# Patient Record
Sex: Male | Born: 1935 | Race: White | Hispanic: No | Marital: Single | Smoking: Never smoker
Health system: Southern US, Community
[De-identification: ages and names within clinical notes are randomized; demographics above are authoritative.]

## PROBLEM LIST (undated history)

## (undated) DIAGNOSIS — K219 Gastro-esophageal reflux disease without esophagitis: Secondary | ICD-10-CM

## (undated) DIAGNOSIS — E785 Hyperlipidemia, unspecified: Secondary | ICD-10-CM

## (undated) DIAGNOSIS — I10 Essential (primary) hypertension: Secondary | ICD-10-CM

## (undated) DIAGNOSIS — L570 Actinic keratosis: Secondary | ICD-10-CM

## (undated) DIAGNOSIS — Z9289 Personal history of other medical treatment: Secondary | ICD-10-CM

## (undated) HISTORY — DX: Actinic keratosis: L57.0

## (undated) HISTORY — PX: OTHER SURGICAL HISTORY: SHX169

## (undated) SURGERY — Surgical Case
Anesthesia: *Unknown

---

## 2011-05-21 HISTORY — PX: REPLACEMENT TOTAL KNEE: SUR1224

## 2011-08-19 HISTORY — PX: KNEE ARTHROSCOPY: SUR90

## 2014-05-20 DIAGNOSIS — Z9289 Personal history of other medical treatment: Secondary | ICD-10-CM

## 2014-05-20 HISTORY — DX: Personal history of other medical treatment: Z92.89

## 2014-08-18 DIAGNOSIS — I10 Essential (primary) hypertension: Secondary | ICD-10-CM | POA: Insufficient documentation

## 2014-08-18 DIAGNOSIS — E785 Hyperlipidemia, unspecified: Secondary | ICD-10-CM | POA: Insufficient documentation

## 2016-01-12 DIAGNOSIS — M779 Enthesopathy, unspecified: Secondary | ICD-10-CM | POA: Insufficient documentation

## 2016-01-12 DIAGNOSIS — Z981 Arthrodesis status: Secondary | ICD-10-CM | POA: Insufficient documentation

## 2016-02-07 ENCOUNTER — Encounter
Admission: RE | Disposition: A | Discharge status: Home / Self Care | Payer: Self-pay | Source: Ambulatory Visit | Attending: Surgery

## 2016-02-07 ENCOUNTER — Ambulatory Visit: Payer: Medicare Other | Admitting: Anesthesiology

## 2016-02-07 ENCOUNTER — Ambulatory Visit
Admission: RE | Admit: 2016-02-07 | Discharge: 2016-02-07 | Disposition: A | Discharge status: Home / Self Care | Payer: Medicare Other | Source: Ambulatory Visit | Attending: Surgery | Admitting: Surgery

## 2016-02-07 DIAGNOSIS — M109 Gout, unspecified: Secondary | ICD-10-CM | POA: Insufficient documentation

## 2016-02-07 DIAGNOSIS — R2231 Localized swelling, mass and lump, right upper limb: Secondary | ICD-10-CM | POA: Diagnosis present

## 2016-02-07 DIAGNOSIS — I10 Essential (primary) hypertension: Secondary | ICD-10-CM | POA: Diagnosis not present

## 2016-02-07 DIAGNOSIS — K219 Gastro-esophageal reflux disease without esophagitis: Secondary | ICD-10-CM | POA: Insufficient documentation

## 2016-02-07 DIAGNOSIS — Z472 Encounter for removal of internal fixation device: Secondary | ICD-10-CM | POA: Diagnosis not present

## 2016-02-07 DIAGNOSIS — Z79899 Other long term (current) drug therapy: Secondary | ICD-10-CM | POA: Insufficient documentation

## 2016-02-07 DIAGNOSIS — E785 Hyperlipidemia, unspecified: Secondary | ICD-10-CM | POA: Diagnosis not present

## 2016-02-07 HISTORY — PX: HARDWARE REMOVAL: SHX979

## 2016-02-07 HISTORY — DX: Gastro-esophageal reflux disease without esophagitis: K21.9

## 2016-02-07 HISTORY — DX: Essential (primary) hypertension: I10

## 2016-02-07 HISTORY — DX: Personal history of other medical treatment: Z92.89

## 2016-02-07 HISTORY — DX: Hyperlipidemia, unspecified: E78.5

## 2016-02-07 SURGERY — REMOVAL, HARDWARE
Anesthesia: Monitor Anesthesia Care | Laterality: Right | Wound class: Clean

## 2016-02-07 MED ORDER — BUPIVACAINE HCL (PF) 0.5 % IJ SOLN
INTRAMUSCULAR | Status: DC | PRN
  Administered 2016-02-07: 5 mL

## 2016-02-07 MED ORDER — OXYCODONE HCL 5 MG/5ML PO SOLN: 5.0000 mg | Freq: Once | ORAL | Status: DC | PRN

## 2016-02-07 MED ORDER — MIDAZOLAM HCL 5 MG/5ML IJ SOLN
INTRAMUSCULAR | Status: DC | PRN
  Administered 2016-02-07: 2 mg via INTRAVENOUS

## 2016-02-07 MED ORDER — FENTANYL CITRATE (PF) 100 MCG/2ML IJ SOLN
INTRAMUSCULAR | Status: DC | PRN
  Administered 2016-02-07 (×2): 50 ug via INTRAVENOUS

## 2016-02-07 MED ORDER — PROPOFOL 500 MG/50ML IV EMUL
INTRAVENOUS | Status: DC | PRN
  Administered 2016-02-07: 75 ug/kg/min via INTRAVENOUS

## 2016-02-07 MED ORDER — FENTANYL CITRATE (PF) 100 MCG/2ML IJ SOLN: 25.0000 ug | INTRAMUSCULAR | Status: DC | PRN

## 2016-02-07 MED ORDER — OXYCODONE HCL 5 MG PO TABS: 5.0000 mg | ORAL_TABLET | Freq: Once | ORAL | Status: DC | PRN

## 2016-02-07 MED ORDER — LIDOCAINE HCL (CARDIAC) 20 MG/ML IV SOLN
INTRAVENOUS | Status: DC | PRN
  Administered 2016-02-07: 50 mg via INTRAVENOUS

## 2016-02-07 MED ORDER — ONDANSETRON HCL 4 MG/2ML IJ SOLN: 4.0000 mg | Freq: Once | INTRAMUSCULAR | Status: DC | PRN

## 2016-02-07 MED ORDER — CEFAZOLIN SODIUM-DEXTROSE 2-4 GM/100ML-% IV SOLN
2.0000 g | Freq: Once | INTRAVENOUS | Status: AC
  Administered 2016-02-07: 2 g via INTRAVENOUS

## 2016-02-07 MED ORDER — LACTATED RINGERS IV SOLN
INTRAVENOUS | Status: DC
  Administered 2016-02-07: 12:00:00 via INTRAVENOUS

## 2016-02-07 SURGICAL SUPPLY — 34 items
BANDAGE ELASTIC 3 LF NS (GAUZE/BANDAGES/DRESSINGS) IMPLANT
BANDAGE ELASTIC 4 CLIP ST LF (GAUZE/BANDAGES/DRESSINGS) IMPLANT
BNDG COHESIVE 4X5 TAN STRL (GAUZE/BANDAGES/DRESSINGS) ×2 IMPLANT
BNDG GAUZE 1X2.1 STRL (MISCELLANEOUS) ×2 IMPLANT
CANISTER SUCT 1200ML W/VALVE (MISCELLANEOUS) ×2 IMPLANT
CHLORAPREP W/TINT 26ML (MISCELLANEOUS) ×4 IMPLANT
CORD BIP STRL DISP 12FT (MISCELLANEOUS) IMPLANT
COVER LIGHT HANDLE UNIVERSAL (MISCELLANEOUS) ×4 IMPLANT
DRAPE FLUOR MINI C-ARM 54X84 (DRAPES) ×2 IMPLANT
DRAPE INCISE IOBAN 66X45 STRL (DRAPES) IMPLANT
GAUZE PACKING 1/4X5YD (GAUZE/BANDAGES/DRESSINGS) ×2 IMPLANT
GAUZE PETRO XEROFOAM 1X8 (MISCELLANEOUS) ×2 IMPLANT
GAUZE SPONGE 4X4 12PLY STRL (GAUZE/BANDAGES/DRESSINGS) IMPLANT
GLOVE BIO SURGEON STRL SZ8 (GLOVE) ×4 IMPLANT
GLOVE INDICATOR 8.0 STRL GRN (GLOVE) ×2 IMPLANT
GLOVE PI ULTRA LF STRL 7.5 (GLOVE) IMPLANT
GLOVE PI ULTRA NON LATEX 7.5 (GLOVE)
GOWN STRL REUS W/ TWL LRG LVL3 (GOWN DISPOSABLE) ×1 IMPLANT
GOWN STRL REUS W/ TWL XL LVL3 (GOWN DISPOSABLE) ×1 IMPLANT
GOWN STRL REUS W/TWL LRG LVL3 (GOWN DISPOSABLE) ×1
GOWN STRL REUS W/TWL XL LVL3 (GOWN DISPOSABLE) ×1
KIT ROOM TURNOVER OR (KITS) ×2 IMPLANT
NEEDLE 18GX1X1/2 (RX/OR ONLY) (NEEDLE) IMPLANT
NS IRRIG 500ML POUR BTL (IV SOLUTION) ×2 IMPLANT
PACK EXTREMITY ARMC (MISCELLANEOUS) ×2 IMPLANT
PAD GROUND ADULT SPLIT (MISCELLANEOUS) IMPLANT
STAPLER SKIN PROX 35W (STAPLE) IMPLANT
STOCKINETTE IMPERVIOUS 9X36 MD (GAUZE/BANDAGES/DRESSINGS) IMPLANT
STOCKINETTE IMPERVIOUS LG (DRAPES) IMPLANT
STRAP BODY AND KNEE 60X3 (MISCELLANEOUS) ×2 IMPLANT
SUT PROLENE 4 0 PS 2 18 (SUTURE) IMPLANT
SUT VIC AB 2-0 SH 27 (SUTURE)
SUT VIC AB 2-0 SH 27XBRD (SUTURE) IMPLANT
SYRINGE 10CC LL (SYRINGE) IMPLANT

## 2016-02-07 NOTE — H&P (Signed)
Paper H&P to be scanned into permanent record. H&P reviewed. No changes. 

## 2016-02-07 NOTE — Anesthesia Procedure Notes (Signed)
Anesthesia Regional Block:  Bier block (IV Regional)  Pre-Anesthetic Checklist: ,, timeout performed, Correct Patient, Correct Site, Correct Laterality, Correct Procedure, Correct Position, site marked, Risks and benefits discussed, Surgical consent,  Pre-op evaluation,  At surgeon's request  Laterality: Right  Prep: chloraprep       Bier block (IV Regional) Narrative:  Start time: 02/07/2016 1:35 PM End time: 02/07/2016 1:37 PM Injection made incrementally with aspirations every 30 mL.

## 2016-02-07 NOTE — Anesthesia Postprocedure Evaluation (Signed)
Anesthesia Post Note  Patient: Sean Hines  Procedure(s) Performed: Procedure(s) (LRB): RIGHT INDEX FINGER HARDWARE REMOVAL WITH EXCISION OF BONE SPUR (Right)  Patient location during evaluation: PACU Anesthesia Type: MAC and Bier Block Level of consciousness: awake and alert Pain management: pain level controlled Vital Signs Assessment: post-procedure vital signs reviewed and stable Respiratory status: spontaneous breathing, nonlabored ventilation, respiratory function stable and patient connected to nasal cannula oxygen Cardiovascular status: stable and blood pressure returned to baseline Anesthetic complications: no    Amaryllis Dyke

## 2016-02-07 NOTE — Anesthesia Procedure Notes (Signed)
Procedure Name: MAC Performed by: Kynzlie Hilleary Pre-anesthesia Checklist: Patient identified, Emergency Drugs available, Suction available, Timeout performed and Patient being monitored Patient Re-evaluated:Patient Re-evaluated prior to inductionOxygen Delivery Method: Nasal cannula Placement Confirmation: positive ETCO2     

## 2016-02-07 NOTE — Transfer of Care (Signed)
Immediate Anesthesia Transfer of Care Note  Patient: Sean Hines  Procedure(s) Performed: Procedure(s): RIGHT INDEX FINGER HARDWARE REMOVAL WITH EXCISION OF BONE SPUR (Right)  Patient Location: PACU  Anesthesia Type: MAC, Bier Block  Level of Consciousness: awake, alert  and patient cooperative  Airway and Oxygen Therapy: Patient Spontanous Breathing and Patient connected to supplemental oxygen  Post-op Assessment: Post-op Vital signs reviewed, Patient's Cardiovascular Status Stable, Respiratory Function Stable, Patent Airway and No signs of Nausea or vomiting  Post-op Vital Signs: Reviewed and stable  Complications: No apparent anesthesia complications

## 2016-02-07 NOTE — Discharge Instructions (Signed)
General Anesthesia, Adult, Care After Refer to this sheet in the next few weeks. These instructions provide you with information on caring for yourself after your procedure. Your health care provider may also give you more specific instructions. Your treatment has been planned according to current medical practices, but problems sometimes occur. Call your health care provider if you have any problems or questions after your procedure. WHAT TO EXPECT AFTER THE PROCEDURE After the procedure, it is typical to experience:  Sleepiness.  Nausea and vomiting. HOME CARE INSTRUCTIONS  For the first 24 hours after general anesthesia:  Have a responsible person with you.  Do not drive a car. If you are alone, do not take public transportation.  Do not drink alcohol.  Do not take medicine that has not been prescribed by your health care provider.  Do not sign important papers or make important decisions.  You may resume a normal diet and activities as directed by your health care provider.  Change bandages (dressings) as directed.  If you have questions or problems that seem related to general anesthesia, call the hospital and ask for the anesthetist or anesthesiologist on call. SEEK MEDICAL CARE IF:  You have nausea and vomiting that continue the day after anesthesia.  You develop a rash. SEEK IMMEDIATE MEDICAL CARE IF:   You have difficulty breathing.  You have chest pain.  You have any allergic problems.   This information is not intended to replace advice given to you by your health care provider. Make sure you discuss any questions you have with your health care provider.   Document Released: 08/12/2000 Document Revised: 05/27/2014 Document Reviewed: 09/04/2011 Elsevier Interactive Patient Education 2016 [employer].  Keep dressing dry and intact. Keep hand elevated above heart level. May shower after dressing removed on postop day 4 (Monday). Cover sutures with Band-Aids  after drying off. Apply ice to affected area frequently. Take ibuprofen 600 mg TID with meals for 7-10 days, then as necessary. Take ES Tylenol as necessary if needed.  Return for follow-up in 10-14 days or as scheduled.

## 2016-02-07 NOTE — Op Note (Signed)
02/07/2016  2:19 PM  Patient:   Sean Hines  Pre-Op Diagnosis:   Retained surgical hardware with painful osteophyte, right index DIP joint.  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  Procedure:   Hardware removal, excision of osteophyte, and debridement of gouty tophus, right index DIP joint.  Surgeon:   Pascal Lux, MD  Assistant:   Donnie Coffin, PA-S  Anesthesia:   Bier block  Findings:   As above.  Complications:   None  EBL:   1 cc  Fluids:   900 cc crystalloid  TT:   39 minutes at 250 mmHg  Drains:   None  Closure:   4-0 Prolene  Implants:   None  Brief Clinical Note:   The patient is an 82 year old male who is many years status post a right index DIP joint fusion. Over the past few months, he has noted an increasingly painful prominence on the ulnar aspect of the DIP joint. X-rays demonstrate a retained surgical pin with an osteophyte ulnarly. The patient presents at this time for hardware removal and debridement of the osteophyte.  Procedure:   The patient was brought into the operating room and lain in the supine position. After adequate IV sedation was achieved, a timeout was performed to verify the appropriate surgical site. A Bier block was placed by the anesthesiologist and the tourniquet inflated to 250 mmHg. The right hand and upper extremity were prepped with ChloraPrep solution before being draped sterilely. Preoperative antibiotics were administered. An approximately 1.5 cm incision was made over the ulnar aspect of the right index finger at the level of the DIP joint, centered over the mass. The incision was carried down through subcutaneous tissues. 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 The tophus was debrided in its entirety using a Leisure centre manager. A small osteophyte also was identified and removed with a rongeur. The pin itself was removed using a needle driver,  twisting and out counterclockwise. Subsequent FluoroScan imaging in AP and lateral projections demonstrated satisfactory removal of the hardware with satisfactory debridement of the osteophyte.  The wound was copiously irrigated with sterile saline solution before the wound was closed using 4-0 Prolene interrupted sutures. A total of 5 cc of 0.5% plain Sensorcaine was injected as a digital block to help with postoperative analgesia before a sterile bulky dressing was applied to the finger. The patient was then awakened and returned to the recovery room in satisfactory condition after tolerating the procedure well.

## 2016-02-07 NOTE — Anesthesia Preprocedure Evaluation (Signed)
Anesthesia Evaluation  Patient identified by MRN, date of birth, ID band Patient awake    Reviewed: Allergy & Precautions, H&P , NPO status   Airway Mallampati: II  TM Distance: >3 FB Neck ROM: full    Dental   Pulmonary    Pulmonary exam normal        Cardiovascular hypertension, Normal cardiovascular exam     Neuro/Psych    GI/Hepatic GERD  ,  Endo/Other    Renal/GU      Musculoskeletal   Abdominal   Peds  Hematology   Anesthesia Other Findings   Reproductive/Obstetrics                             Anesthesia Physical Anesthesia Plan  ASA: II  Anesthesia Plan: MAC and Bier Block   Post-op Pain Management:    Induction:   Airway Management Planned:   Additional Equipment:   Intra-op Plan:   Post-operative Plan:   Informed Consent: I have reviewed the patients History and Physical, chart, labs and discussed the procedure including the risks, benefits and alternatives for the proposed anesthesia with the patient or authorized representative who has indicated his/her understanding and acceptance.     Plan Discussed with: CRNA  Anesthesia Plan Comments:         Anesthesia Quick Evaluation

## 2016-02-08 ENCOUNTER — Encounter: Payer: Self-pay | Admitting: Surgery

## 2016-02-08 DIAGNOSIS — M1A0411 Idiopathic chronic gout, right hand, with tophus (tophi): Secondary | ICD-10-CM | POA: Insufficient documentation

## 2016-02-13 ENCOUNTER — Other Ambulatory Visit: Payer: Self-pay | Admitting: Internal Medicine

## 2016-02-13 DIAGNOSIS — M545 Low back pain: Secondary | ICD-10-CM

## 2016-02-13 DIAGNOSIS — M7918 Myalgia, other site: Secondary | ICD-10-CM

## 2016-02-26 ENCOUNTER — Ambulatory Visit
Admission: RE | Admit: 2016-02-26 | Discharge: 2016-02-26 | Disposition: A | Discharge status: Home / Self Care | Payer: Medicare Other | Source: Ambulatory Visit | Attending: Internal Medicine | Admitting: Internal Medicine

## 2016-02-26 DIAGNOSIS — M545 Low back pain, unspecified: Secondary | ICD-10-CM

## 2016-02-26 DIAGNOSIS — M791 Myalgia: Secondary | ICD-10-CM | POA: Insufficient documentation

## 2016-02-26 DIAGNOSIS — M4807 Spinal stenosis, lumbosacral region: Secondary | ICD-10-CM | POA: Insufficient documentation

## 2016-02-26 DIAGNOSIS — M47896 Other spondylosis, lumbar region: Secondary | ICD-10-CM | POA: Diagnosis not present

## 2016-02-26 DIAGNOSIS — M7918 Myalgia, other site: Secondary | ICD-10-CM

## 2016-02-26 LAB — POCT I-STAT CREATININE: CREATININE: 1.4 mg/dL — AB (ref 0.61–1.24)

## 2016-02-26 MED ORDER — GADOBENATE DIMEGLUMINE 529 MG/ML IV SOLN
15.0000 mL | Freq: Once | INTRAVENOUS | Status: AC | PRN
Start: 2016-02-26 — End: 2016-02-26
  Administered 2016-02-26: 12 mL via INTRAVENOUS

## 2017-01-07 DIAGNOSIS — G8929 Other chronic pain: Secondary | ICD-10-CM | POA: Insufficient documentation

## 2017-03-07 DIAGNOSIS — M48062 Spinal stenosis, lumbar region with neurogenic claudication: Secondary | ICD-10-CM | POA: Insufficient documentation

## 2018-02-12 ENCOUNTER — Other Ambulatory Visit: Payer: Self-pay | Admitting: Podiatry

## 2018-02-13 ENCOUNTER — Other Ambulatory Visit: Payer: Self-pay

## 2018-02-16 NOTE — Discharge Instructions (Signed)
[facility] °[facility] ° °POST OPERATIVE INSTRUCTIONS FOR DR. TROXLER AND DR. FOWLER °[facility] ° ° °1. Take your medication as prescribed.  Pain medication should be taken only as needed. ° °2. Keep the dressing clean, dry and intact. ° °3. Keep your foot elevated above the heart level for the first 48 hours. ° °4. Walking to the bathroom and brief periods of walking are acceptable, unless we have instructed you to be non-weight bearing. ° °5. Always wear your post-op shoe when walking.  Always use your crutches if you are to be non-weight bearing. ° °6. Do not take a shower. Baths are permissible as long as the foot is kept out of the water.  ° °7. Every hour you are awake:  °- Bend your knee 15 times. °- Flex foot 15 times °- Massage calf 15 times ° °8. Call [facility] ([phone]) if any of the following problems occur: °- You develop a temperature or fever. °- The bandage becomes saturated with blood. °- Medication does not stop your pain. °- Injury of the foot occurs. °- Any symptoms of infection including redness, odor, or red streaks running from wound. ° ° °General Anesthesia, Adult, Care After °These instructions provide you with information about caring for yourself after your procedure. Your health care provider may also give you more specific instructions. Your treatment has been planned according to current medical practices, but problems sometimes occur. Call your health care provider if you have any problems or questions after your procedure. °What can I expect after the procedure? °After the procedure, it is common to have: °· Vomiting. °· A sore throat. °· Mental slowness. ° °It is common to feel: °· Nauseous. °· Cold or shivery. °· Sleepy. °· Tired. °· Sore or achy, even in parts of your body where you did not have surgery. ° °Follow these instructions at home: °For at least 24 hours after the procedure: °· Do not: °? Participate in  activities where you could fall or become injured. °? Drive. °? Use heavy machinery. °? Drink alcohol. °? Take sleeping pills or medicines that cause drowsiness. °? Make important decisions or sign legal documents. °? Take care of children on your own. °· Rest. °Eating and drinking °· If you vomit, drink water, juice, or soup when you can drink without vomiting. °· Drink enough fluid to keep your urine clear or pale yellow. °· Make sure you have little or no nausea before eating solid foods. °· Follow the diet recommended by your health care provider. °General instructions °· Have a responsible adult stay with you until you are awake and alert. °· Return to your normal activities as told by your health care provider. Ask your health care provider what activities are safe for you. °· Take over-the-counter and prescription medicines only as told by your health care provider. °· If you smoke, do not smoke without supervision. °· Keep all follow-up visits as told by your health care provider. This is important. °Contact a health care provider if: °· You continue to have nausea or vomiting at home, and medicines are not helpful. °· You cannot drink fluids or start eating again. °· You cannot urinate after 8-12 hours. °· You develop a skin rash. °· You have fever. °· You have increasing redness at the site of your procedure. °Get help right away if: °· You have difficulty breathing. °· You have chest pain. °· You have unexpected bleeding. °· You feel that you   are having a life-threatening or urgent problem. °This information is not intended to replace advice given to you by your health care provider. Make sure you discuss any questions you have with your health care provider. °Document Released: 08/12/2000 Document Revised: 10/09/2015 Document Reviewed: 04/20/2015 °Elsevier Interactive Patient Education © 2018 Elsevier Inc. ° °

## 2018-02-19 ENCOUNTER — Encounter
Admission: RE | Disposition: A | Discharge status: Home / Self Care | Payer: Self-pay | Source: Ambulatory Visit | Attending: Podiatry

## 2018-02-19 ENCOUNTER — Ambulatory Visit: Payer: Medicare Other | Admitting: Anesthesiology

## 2018-02-19 ENCOUNTER — Ambulatory Visit
Admission: RE | Admit: 2018-02-19 | Discharge: 2018-02-19 | Disposition: A | Discharge status: Home / Self Care | Payer: Medicare Other | Source: Ambulatory Visit | Attending: Podiatry | Admitting: Podiatry

## 2018-02-19 DIAGNOSIS — M1A9XX1 Chronic gout, unspecified, with tophus (tophi): Secondary | ICD-10-CM | POA: Insufficient documentation

## 2018-02-19 DIAGNOSIS — Z79899 Other long term (current) drug therapy: Secondary | ICD-10-CM | POA: Insufficient documentation

## 2018-02-19 DIAGNOSIS — I10 Essential (primary) hypertension: Secondary | ICD-10-CM | POA: Diagnosis not present

## 2018-02-19 DIAGNOSIS — Z87891 Personal history of nicotine dependence: Secondary | ICD-10-CM | POA: Insufficient documentation

## 2018-02-19 DIAGNOSIS — K219 Gastro-esophageal reflux disease without esophagitis: Secondary | ICD-10-CM | POA: Diagnosis not present

## 2018-02-19 DIAGNOSIS — E785 Hyperlipidemia, unspecified: Secondary | ICD-10-CM | POA: Insufficient documentation

## 2018-02-19 HISTORY — PX: EXCISION PARTIAL PHALANX: SHX6617

## 2018-02-19 SURGERY — EXCISION, PHALANX, PARTIAL
Anesthesia: General | Laterality: Left

## 2018-02-19 MED ORDER — OXYCODONE HCL 5 MG/5ML PO SOLN: 5.0000 mg | Freq: Once | ORAL | Status: DC | PRN

## 2018-02-19 MED ORDER — PROPOFOL 10 MG/ML IV BOLUS
INTRAVENOUS | Status: DC | PRN
  Administered 2018-02-19: 50 mg via INTRAVENOUS

## 2018-02-19 MED ORDER — FENTANYL CITRATE (PF) 100 MCG/2ML IJ SOLN
INTRAMUSCULAR | Status: DC | PRN
  Administered 2018-02-19: 50 ug via INTRAVENOUS

## 2018-02-19 MED ORDER — OXYCODONE HCL 5 MG PO TABS: 5.0000 mg | ORAL_TABLET | Freq: Once | ORAL | Status: DC | PRN

## 2018-02-19 MED ORDER — FENTANYL CITRATE (PF) 100 MCG/2ML IJ SOLN: 25.0000 ug | INTRAMUSCULAR | Status: DC | PRN

## 2018-02-19 MED ORDER — LACTATED RINGERS IV SOLN
INTRAVENOUS | Status: DC
  Administered 2018-02-19: 07:00:00 via INTRAVENOUS

## 2018-02-19 MED ORDER — HYDROCODONE-ACETAMINOPHEN 5-325 MG PO TABS: 1.0000 | ORAL_TABLET | Freq: Four times a day (QID) | ORAL | 0 refills | Status: DC | PRN

## 2018-02-19 MED ORDER — PROPOFOL 500 MG/50ML IV EMUL
INTRAVENOUS | Status: DC | PRN
  Administered 2018-02-19: 140 ug/kg/min via INTRAVENOUS

## 2018-02-19 MED ORDER — CEFAZOLIN SODIUM-DEXTROSE 2-4 GM/100ML-% IV SOLN
2.0000 g | INTRAVENOUS | Status: AC
  Administered 2018-02-19: 2 g via INTRAVENOUS

## 2018-02-19 MED ORDER — ONDANSETRON HCL 4 MG/2ML IJ SOLN
INTRAMUSCULAR | Status: DC | PRN
  Administered 2018-02-19: 4 mg via INTRAVENOUS

## 2018-02-19 MED ORDER — BUPIVACAINE HCL (PF) 0.5 % IJ SOLN
INTRAMUSCULAR | Status: DC | PRN
  Administered 2018-02-19 (×2): 1.5 mL

## 2018-02-19 MED ORDER — POVIDONE-IODINE 7.5 % EX SOLN
Freq: Once | CUTANEOUS | Status: AC
  Administered 2018-02-19: 07:00:00 via TOPICAL

## 2018-02-19 MED ORDER — EPHEDRINE SULFATE 50 MG/ML IJ SOLN
INTRAMUSCULAR | Status: DC | PRN
  Administered 2018-02-19 (×2): 5 mg via INTRAVENOUS

## 2018-02-19 SURGICAL SUPPLY — 30 items
BANDAGE ELASTIC 4 LF NS (GAUZE/BANDAGES/DRESSINGS) ×2 IMPLANT
BENZOIN TINCTURE PRP APPL 2/3 (GAUZE/BANDAGES/DRESSINGS) ×2 IMPLANT
BLADE MINI RND TIP GREEN BEAV (BLADE) ×1 IMPLANT
BLADE OSC/SAGITTAL MD 5.5X18 (BLADE) ×1 IMPLANT
BNDG GAUZE 4.5X4.1 6PLY STRL (MISCELLANEOUS) ×2 IMPLANT
BNDG STRETCH 4X75 STRL LF (GAUZE/BANDAGES/DRESSINGS) ×2 IMPLANT
CANISTER SUCT 1200ML W/VALVE (MISCELLANEOUS) ×2 IMPLANT
COVER LIGHT HANDLE UNIVERSAL (MISCELLANEOUS) ×4 IMPLANT
CUFF TOURN SGL QUICK 18 (TOURNIQUET CUFF) ×1 IMPLANT
DURAPREP 26ML APPLICATOR (WOUND CARE) ×2 IMPLANT
ELECT REM PT RETURN 9FT ADLT (ELECTROSURGICAL) ×2
ELECTRODE REM PT RTRN 9FT ADLT (ELECTROSURGICAL) ×1 IMPLANT
GAUZE PETRO XEROFOAM 1X8 (MISCELLANEOUS) ×2 IMPLANT
GAUZE SPONGE 4X4 12PLY STRL (GAUZE/BANDAGES/DRESSINGS) ×2 IMPLANT
GLOVE BIO SURGEON STRL SZ8 (GLOVE) ×2 IMPLANT
GOWN STRL REUS W/ TWL LRG LVL3 (GOWN DISPOSABLE) ×1 IMPLANT
GOWN STRL REUS W/ TWL XL LVL3 (GOWN DISPOSABLE) ×1 IMPLANT
GOWN STRL REUS W/TWL LRG LVL3 (GOWN DISPOSABLE) ×1
GOWN STRL REUS W/TWL XL LVL3 (GOWN DISPOSABLE) ×1
KIT TURNOVER KIT A (KITS) ×2 IMPLANT
NDL HYPO 18GX1.5 BLUNT FILL (NEEDLE) IMPLANT
NDL HYPO 25GX1X1/2 BEV (NEEDLE) IMPLANT
NEEDLE HYPO 18GX1.5 BLUNT FILL (NEEDLE) ×2 IMPLANT
NEEDLE HYPO 25GX1X1/2 BEV (NEEDLE) ×2 IMPLANT
NS IRRIG 500ML POUR BTL (IV SOLUTION) ×2 IMPLANT
PACK EXTREMITY ARMC (MISCELLANEOUS) ×2 IMPLANT
PENCIL SMOKE EVACUATOR (MISCELLANEOUS) ×2 IMPLANT
SUT ETHILON 5-0 FS-2 18 BLK (SUTURE) ×1 IMPLANT
SUT VIC AB 4-0 FS2 27 (SUTURE) ×1 IMPLANT
SYR 10ML LL (SYRINGE) ×1 IMPLANT

## 2018-02-19 NOTE — Transfer of Care (Signed)
Immediate Anesthesia Transfer of Care Note  Patient: Sean Hines  Procedure(s) Performed: EXCISION PARTIAL PHALANX-2ND TOE (Left )  Patient Location: PACU  Anesthesia Type: General  Level of Consciousness: awake, alert  and patient cooperative  Airway and Oxygen Therapy: Patient Spontanous Breathing and Patient connected to supplemental oxygen  Post-op Assessment: Post-op Vital signs reviewed, Patient's Cardiovascular Status Stable, Respiratory Function Stable, Patent Airway and No signs of Nausea or vomiting  Post-op Vital Signs: Reviewed and stable  Complications: No apparent anesthesia complications

## 2018-02-19 NOTE — Anesthesia Procedure Notes (Signed)
Performed by: Cameron Ali, CRNA Pre-anesthesia Checklist: Patient identified, Emergency Drugs available, Suction available, Patient being monitored and Timeout performed Patient Re-evaluated:Patient Re-evaluated prior to induction Oxygen Delivery Method: Simple face mask Placement Confirmation: positive ETCO2 and breath sounds checked- equal and bilateral Dental Injury: Teeth and Oropharynx as per pre-operative assessment

## 2018-02-19 NOTE — Op Note (Signed)
Operative note   Surgeon: Dr. Albertine Patricia, DPM.    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  Resection of middle phalanx head left second toe   2.  Removal of gout crystals/tophaceous gout DIPJ left second toe      EBL: Less than 5 cc    Anesthesia:IV sedation delivered by anesthesia team I injected the toe with 1.5 cc of 0.5% Marcaine plain preoperatively and postoperatively.    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 After anesthesia was obtained theleft lower extremity was prepped and draped in usual sterile fashion.  Operative Report: This time tissue directed to the second toe of the left foot where 2 transverse semielliptical incisions were made of the DIP joint.  This lip skin was then removed extensive gouty crystals were identified at this point and cleaned out as much as possible most of them were in a fairly solid state at this juncture I used a combination of curettage and scraping with a Beaver blade to remove as many gout crystals as possible from the superficial aspect of the joint.  Once this is was removed as much possible I made a transverse incision to the extensor tendon and reflected proximally.  More extensive gouty crystals and tophaceous development was encountered and as much of it was removed as possible using same type of techniques.  The distal phalanx medullary canal was pretty much filled with gout crystals this was cleaned out and irrigated.  All areas were copiously irrigated to remove as many gout crystals as possible.  At this  point the extensor tendon was then reapproximated with 4-0 Vicryl simple rib sutures.  Simple erupted nylon suture was then placed across the dorsal aspect of the wound purchasing none of the skin but also the extensor tendon to offer more stability and support across that region until healing can occur. A sterile dressing was then placed across the area consisting of Xeroform gauze 4 x 4's Kling and Kerlix.    Patient tolerated the procedure and anesthesia well.  Was transported from the OR to the PACU with all vital signs stable and vascular status intact. To be discharged per routine protocol.  Will follow up in approximately 1 week in the outpatient clinic.

## 2018-02-19 NOTE — Anesthesia Preprocedure Evaluation (Signed)
Anesthesia Evaluation  Patient identified by MRN, date of birth, ID band Patient awake    Reviewed: Allergy & Precautions, H&P , NPO status , Patient's Chart, lab work & pertinent test results  History of Anesthesia Complications Negative for: history of anesthetic complications  Airway Mallampati: II  TM Distance: >3 FB Neck ROM: full    Dental no notable dental hx.    Pulmonary neg pulmonary ROS,    Pulmonary exam normal        Cardiovascular hypertension, On Medications Normal cardiovascular exam     Neuro/Psych    GI/Hepatic Neg liver ROS, Medicated,  Endo/Other  negative endocrine ROS  Renal/GU negative Renal ROS     Musculoskeletal   Abdominal   Peds  Hematology negative hematology ROS (+)   Anesthesia Other Findings   Reproductive/Obstetrics                             Anesthesia Physical Anesthesia Plan  ASA: II  Anesthesia Plan: General   Post-op Pain Management:    Induction:   PONV Risk Score and Plan:   Airway Management Planned:   Additional Equipment:   Intra-op Plan:   Post-operative Plan:   Informed Consent: I have reviewed the patients History and Physical, chart, labs and discussed the procedure including the risks, benefits and alternatives for the proposed anesthesia with the patient or authorized representative who has indicated his/her understanding and acceptance.     Plan Discussed with:   Anesthesia Plan Comments:         Anesthesia Quick Evaluation

## 2018-02-19 NOTE — H&P (Signed)
H and P has been reviewed and no changes are noted.  

## 2018-02-19 NOTE — Anesthesia Postprocedure Evaluation (Signed)
Anesthesia Post Note  Patient: Sean Hines  Procedure(s) Performed: EXCISION PARTIAL PHALANX-2ND TOE (Left )  Patient location during evaluation: PACU Anesthesia Type: General Level of consciousness: awake and alert Pain management: pain level controlled Vital Signs Assessment: post-procedure vital signs reviewed and stable Respiratory status: spontaneous breathing Cardiovascular status: blood pressure returned to baseline Postop Assessment: no headache Anesthetic complications: no    Jaci Standard, III,  Crist Kruszka D

## 2018-02-20 ENCOUNTER — Encounter: Payer: Self-pay | Admitting: Podiatry

## 2018-02-23 LAB — SURGICAL PATHOLOGY

## 2018-07-13 DIAGNOSIS — R972 Elevated prostate specific antigen [PSA]: Secondary | ICD-10-CM | POA: Insufficient documentation

## 2018-07-13 DIAGNOSIS — N183 Chronic kidney disease, stage 3 unspecified: Secondary | ICD-10-CM | POA: Insufficient documentation

## 2018-07-13 DIAGNOSIS — R42 Dizziness and giddiness: Secondary | ICD-10-CM | POA: Insufficient documentation

## 2019-07-15 DIAGNOSIS — Z8739 Personal history of other diseases of the musculoskeletal system and connective tissue: Secondary | ICD-10-CM | POA: Insufficient documentation

## 2019-07-15 DIAGNOSIS — M79671 Pain in right foot: Secondary | ICD-10-CM | POA: Insufficient documentation

## 2019-08-26 ENCOUNTER — Other Ambulatory Visit: Payer: Self-pay

## 2019-08-26 ENCOUNTER — Ambulatory Visit: Payer: Medicare Other | Admitting: Dermatology

## 2019-08-26 ENCOUNTER — Encounter: Payer: Self-pay | Admitting: Dermatology

## 2019-08-26 DIAGNOSIS — L570 Actinic keratosis: Secondary | ICD-10-CM

## 2019-08-26 DIAGNOSIS — L820 Inflamed seborrheic keratosis: Secondary | ICD-10-CM

## 2019-08-26 DIAGNOSIS — L578 Other skin changes due to chronic exposure to nonionizing radiation: Secondary | ICD-10-CM | POA: Diagnosis not present

## 2019-08-26 DIAGNOSIS — D692 Other nonthrombocytopenic purpura: Secondary | ICD-10-CM

## 2019-08-26 DIAGNOSIS — L821 Other seborrheic keratosis: Secondary | ICD-10-CM

## 2019-08-26 NOTE — Progress Notes (Signed)
   Follow-Up Visit   Subjective  Sean Hines is a 86 y.o. male who presents for the following: Actinic Keratosis (10m f/u face).   The following portions of the chart were reviewed this encounter and updated as appropriate: Tobacco  Allergies  Meds  Problems  Med Hx  Surg Hx  Fam Hx      Review of Systems: No other skin or systemic complaints.  Objective  Well appearing patient in no apparent distress; mood and affect are within normal limits.  A focused examination was performed including face, arms, hands. Relevant physical exam findings are noted in the Assessment and Plan.  Objective  face, ears, arms, hands x 17 (17): Pink scaly macules   Objective  L face x 2 (2): Erythematous keratotic or waxy stuck-on papule or plaque.   Assessment & Plan   Actinic Damage - diffuse scaly erythematous macules with underlying dyspigmentation - Recommend daily broad spectrum sunscreen SPF 30+ to sun-exposed areas, reapply every 2 hours as needed.  - Call for new or changing lesions.  Purpura - Violaceous macules and patches - Benign - Related to age, sun damage and/or use of blood thinners - Observe - Can use OTC arnica containing moisturizer such as Dermend Bruise Formula if desired - Call for worsening or other concerns  Seborrheic Keratoses - Stuck-on, waxy, tan-brown papules and plaques  - Discussed benign etiology and prognosis. - Observe - Call for any changes     AK (actinic keratosis) (17) face, ears, arms, hands x 17  Destruction of lesion - face, ears, arms, hands x 17 Complexity: simple   Destruction method: cryotherapy   Informed consent: discussed and consent obtained   Timeout:  patient name, date of birth, surgical site, and procedure verified Lesion destroyed using liquid nitrogen: Yes   Region frozen until ice ball extended beyond lesion: Yes   Outcome: patient tolerated procedure well with no complications   Post-procedure details: wound care  instructions given    Inflamed seborrheic keratosis (2) L face x 2  Destruction of lesion - L face x 2 Complexity: simple   Destruction method: cryotherapy   Informed consent: discussed and consent obtained   Timeout:  patient name, date of birth, surgical site, and procedure verified Lesion destroyed using liquid nitrogen: Yes   Region frozen until ice ball extended beyond lesion: Yes   Outcome: patient tolerated procedure well with no complications   Post-procedure details: wound care instructions given    Return in about 6 months (around 02/25/2020) for TBSE.   I, Othelia Pulling, RMA, am acting as scribe for Sarina Ser, MD .

## 2019-10-03 ENCOUNTER — Encounter: Payer: Self-pay | Admitting: Emergency Medicine

## 2019-10-03 ENCOUNTER — Other Ambulatory Visit: Payer: Self-pay

## 2019-10-03 ENCOUNTER — Emergency Department: Payer: Medicare Other

## 2019-10-03 ENCOUNTER — Emergency Department
Admission: EM | Admit: 2019-10-03 | Discharge: 2019-10-03 | Disposition: A | Discharge status: Home / Self Care | Payer: Medicare Other | Attending: Emergency Medicine | Admitting: Emergency Medicine

## 2019-10-03 DIAGNOSIS — M545 Low back pain: Secondary | ICD-10-CM | POA: Diagnosis not present

## 2019-10-03 DIAGNOSIS — Z96651 Presence of right artificial knee joint: Secondary | ICD-10-CM | POA: Diagnosis not present

## 2019-10-03 DIAGNOSIS — Z79899 Other long term (current) drug therapy: Secondary | ICD-10-CM | POA: Diagnosis not present

## 2019-10-03 DIAGNOSIS — I10 Essential (primary) hypertension: Secondary | ICD-10-CM | POA: Diagnosis not present

## 2019-10-03 DIAGNOSIS — G8929 Other chronic pain: Secondary | ICD-10-CM

## 2019-10-03 LAB — CBC
HCT: 43.2 % (ref 39.0–52.0)
Hemoglobin: 13.9 g/dL (ref 13.0–17.0)
MCH: 30.1 pg (ref 26.0–34.0)
MCHC: 32.2 g/dL (ref 30.0–36.0)
MCV: 93.5 fL (ref 80.0–100.0)
Platelets: 123 10*3/uL — ABNORMAL LOW (ref 150–400)
RBC: 4.62 MIL/uL (ref 4.22–5.81)
RDW: 15.2 % (ref 11.5–15.5)
WBC: 7.3 10*3/uL (ref 4.0–10.5)
nRBC: 0 % (ref 0.0–0.2)

## 2019-10-03 LAB — BASIC METABOLIC PANEL
Anion gap: 12 (ref 5–15)
BUN: 28 mg/dL — ABNORMAL HIGH (ref 8–23)
CO2: 25 mmol/L (ref 22–32)
Calcium: 9.3 mg/dL (ref 8.9–10.3)
Chloride: 101 mmol/L (ref 98–111)
Creatinine, Ser: 1.33 mg/dL — ABNORMAL HIGH (ref 0.61–1.24)
GFR calc Af Amer: 56 mL/min — ABNORMAL LOW (ref >60)
GFR calc non Af Amer: 48 mL/min — ABNORMAL LOW (ref >60)
Glucose, Bld: 120 mg/dL — ABNORMAL HIGH (ref 70–99)
Potassium: 4.3 mmol/L (ref 3.5–5.1)
Sodium: 138 mmol/L (ref 135–145)

## 2019-10-03 MED ORDER — OXYCODONE-ACETAMINOPHEN 5-325 MG PO TABS
1.0000 | ORAL_TABLET | Freq: Once | ORAL | Status: AC
  Administered 2019-10-03: 1 via ORAL
  Filled 2019-10-03: qty 1

## 2019-10-03 MED ORDER — PREDNISONE 10 MG PO TABS: 10.0000 mg | ORAL_TABLET | Freq: Every day | ORAL | 0 refills | Status: DC

## 2019-10-03 MED ORDER — PREDNISONE 20 MG PO TABS
60.0000 mg | ORAL_TABLET | Freq: Once | ORAL | Status: AC
  Administered 2019-10-03: 60 mg via ORAL
  Filled 2019-10-03: qty 3

## 2019-10-03 MED ORDER — METHOCARBAMOL 500 MG PO TABS
500.0000 mg | ORAL_TABLET | Freq: Once | ORAL | Status: AC
  Administered 2019-10-03: 500 mg via ORAL
  Filled 2019-10-03: qty 1

## 2019-10-03 MED ORDER — HYDROCODONE-ACETAMINOPHEN 5-325 MG PO TABS: 1.0000 | ORAL_TABLET | ORAL | 0 refills | Status: AC | PRN

## 2019-10-03 MED ORDER — METHOCARBAMOL 500 MG PO TABS: 500.0000 mg | ORAL_TABLET | Freq: Four times a day (QID) | ORAL | 0 refills | Status: AC

## 2019-10-03 NOTE — ED Triage Notes (Signed)
Pt states that he has been having ongoing back issues but now it is suddenly worse, pt was able to move himself out of the car into the wheelchair with difficulty and assistance

## 2019-10-03 NOTE — ED Provider Notes (Signed)
[facility] Emergency Department Provider Note  ____________________________________________  Time seen: Approximately 5:16 PM  I have reviewed the triage vital signs and the nursing notes.   HISTORY  Chief Complaint Back Pain    HPI Sean Hines is a 86 y.o. male who presents the emergency department for complaint of severe lower back pain.   Patient has a history of degenerative disc disease with multiple ruptured disks.  Patient states that he sees physiatry but has been referred to neurosurgery for discussion of further management.  Patient has sustained no recent trauma.  He has no abdominal or GI complaints.  No urinary symptoms.  Patient is complaining of increased pain to the point that it is decreasing his movement.  Patient has no significant radicular symptoms of numbness or tingling.  No bowel or bladder dysfunction, saddle anesthesia, paresthesias.        Past Medical History:  Diagnosis Date  . Actinic keratosis   . GERD (gastroesophageal reflux disease)   . Hx of exercise stress test 2016   normal per pt  . Hyperlipidemia   . Hypertension     There are no problems to display for this patient.   Past Surgical History:  Procedure Laterality Date  . EXCISION PARTIAL PHALANX Left 02/19/2018   Procedure: EXCISION PARTIAL PHALANX-2ND TOE;  Surgeon: Albertine Patricia, DPM;  Location: [place];  Service: Podiatry;  Laterality: Left;  IVA WITH LOCAL  . hand surgery Right   . HARDWARE REMOVAL Right 02/07/2016   Procedure: Hardware removal, excision of osteophyte, and debridement of gouty tophus, right index DIP joint.;  Surgeon: Corky Mull, MD;  Location: [place];  Service: Orthopedics;  Laterality: Right;  . KNEE ARTHROSCOPY Right 08/2011  . REPLACEMENT TOTAL KNEE Right 2013    Prior to Admission medications   Medication Sig Start Date End Date Taking? Authorizing Provider  ACIDOPHILUS LACTOBACILLUS PO Take by mouth.     [provider]  amLODipine (NORVASC) 10 MG tablet Take 10 mg by mouth daily.    [provider]  atorvastatin (LIPITOR) 10 MG tablet Take 10 mg by mouth daily.    [provider]  cholecalciferol (VITAMIN D) 400 units TABS tablet Take 5,000 Units by mouth daily.    [provider]  folic acid-pyridoxine-cyancobalamin (FOLTX) 2.5-25-2 MG TABS tablet Take 1 tablet by mouth daily.    [provider]  HYDROcodone-acetaminophen (NORCO/VICODIN) 5-325 MG tablet Take 1 tablet by mouth every 4 (four) hours as needed for moderate pain. 10/03/19   Karli Wickizer, Charline Bills, PA-C  magnesium oxide (MAG-OX) 400 MG tablet Take 500 mg by mouth daily.    [provider]  Memantine HCl (NAMENDA XR PO) Take 28 mg by mouth.    [provider]  methocarbamol (ROBAXIN) 500 MG tablet Take 1 tablet (500 mg total) by mouth 4 (four) times daily. 10/03/19   Johnnisha Forton, Charline Bills, PA-C  Multiple Vitamins-Minerals (EYE VITAMINS & MINERALS PO) Take by mouth.    [provider]  omeprazole (PRILOSEC) 40 MG capsule Take 40 mg by mouth daily.    [provider]  potassium gluconate 595 (99 K) MG TABS tablet Take 595 mg by mouth daily.    [provider]  predniSONE (DELTASONE) 10 MG tablet Take 1 tablet (10 mg total) by mouth daily. 10/03/19   Taneal Sonntag, Charline Bills, PA-C  ramipril (ALTACE) 10 MG capsule Take 10 mg by mouth daily.    [provider]  ranitidine (  ZANTAC) 300 MG tablet Take 300 mg by mouth at bedtime.    [provider]  tadalafil (CIALIS) 5 MG tablet Take 5 mg by mouth daily as needed for erectile dysfunction.    [provider]    Allergies Patient has no known allergies.  History reviewed. No pertinent family history.  Social History Social History   Tobacco Use  . Smoking status: Never Smoker  . Smokeless tobacco: Never Used  Substance Use Topics  . Alcohol use: Yes    Alcohol/week: 7.0  standard drinks    Types: 7 Glasses of wine per week    Comment: 1 glass wine/night  . Drug use: Not on file     Review of Systems  Constitutional: No fever/chills Eyes: No visual changes. No discharge ENT: No upper respiratory complaints. Cardiovascular: no chest pain. Respiratory: no cough. No SOB. Gastrointestinal: No abdominal pain.  No nausea, no vomiting.  No diarrhea.  No constipation. Genitourinary: Negative for dysuria. No hematuria Musculoskeletal: Acute lower back pain Skin: Negative for rash, abrasions, lacerations, ecchymosis. Neurological: Negative for headaches, focal weakness or numbness. 10-point ROS otherwise negative.  ____________________________________________   PHYSICAL EXAM:  VITAL SIGNS: ED Triage Vitals  Enc Vitals Group     BP 10/03/19 1406 (!) 172/79     Pulse Rate 10/03/19 1406 80     Resp 10/03/19 1406 16     Temp 10/03/19 1406 98.6 F (37 C)     Temp Source 10/03/19 1406 Oral     SpO2 10/03/19 1406 97 %     Weight 10/03/19 1407 134 lb (60.8 kg)     Height 10/03/19 1407 5\' 6"  (1.676 m)     Head Circumference --      Peak Flow --      Pain Score 10/03/19 1409 9     Pain Loc --      Pain Edu? --      Excl. in [place]? --      Constitutional: Alert and oriented. Well appearing and in no acute distress. Eyes: Conjunctivae are normal. PERRL. EOMI. Head: Atraumatic. ENT:      Ears:       Nose: No congestion/rhinnorhea.      Mouth/Throat: Mucous membranes are moist.  Neck: No stridor.    Cardiovascular: Normal rate, regular rhythm. Normal S1 and S2.  Good peripheral circulation. Respiratory: Normal respiratory effort without tachypnea or retractions. Lungs CTAB. Good air entry to the bases with no decreased or absent breath sounds. Gastrointestinal: Bowel sounds 4 quadrants. Soft and nontender to palpation. No guarding or rigidity. No palpable masses. No distention. No CVA tenderness. Musculoskeletal: Full range of motion to all extremities.  No gross deformities appreciated.  No visible signs of trauma to the lower back.  Limited range of motion due to pain.  Patient is tender to palpation midline over L3-S1 distribution.  No palpable abnormality or step-off.  Tenderness increases specifically over L5, S1.  No tenderness to palpation of the SI joints.  No tenderness to palpation over bilateral sciatic notches.  Positive straight leg raise bilaterally.  Dorsalis pedis pulse and sensation intact bilateral lower extremities. Neurologic:  Normal speech and language. No gross focal neurologic deficits are appreciated.  Skin:  Skin is warm, dry and intact. No rash noted. Psychiatric: Mood and affect are normal. Speech and behavior are normal. Patient exhibits appropriate insight and judgement.   ____________________________________________   LABS (all labs ordered are listed, but only abnormal results are displayed)  Labs  Reviewed  BASIC METABOLIC PANEL - Abnormal; Notable for the following components:      Result Value   Glucose, Bld 120 (*)    BUN 28 (*)    Creatinine, Ser 1.33 (*)    GFR calc non Af Amer 48 (*)    GFR calc Af Amer 56 (*)    All other components within normal limits  CBC - Abnormal; Notable for the following components:   Platelets 123 (*)    All other components within normal limits  URINALYSIS, COMPLETE (UACMP) WITH MICROSCOPIC   ____________________________________________  EKG   ____________________________________________  RADIOLOGY I personally viewed and evaluated these images as part of my medical decision making, as well as reviewing the written report by the radiologist.  CT Lumbar Spine Wo Contrast  Result Date: 10/03/2019 CLINICAL DATA:  Acute on chronic low back pain, no history of trauma EXAM: CT LUMBAR SPINE WITHOUT CONTRAST TECHNIQUE: Multidetector CT imaging of the lumbar spine was performed without intravenous contrast administration. Multiplanar CT image reconstructions were also  generated. COMPARISON:  Lumbar MRI 02/26/2016 FINDINGS: Segmentation: 5 normally formed lumbar type vertebral bodies are seen with the lowest fully formed disc space denoted as L5-S1 in keeping with the schema seen on prior MRI. Alignment: Levoconvex curvature of the lumbar spine with an apex at the L3-4 disc. Unchanged from prior exam. Unchanged mild stepwise retrolisthesis L1-2 L3. No traumatic listhesis or spondylolysis. Vertebrae: There are age-indeterminate incomplete burst fractures involving the superior endplate of the 624THL and T12 levels with approximately 20% height loss anteriorly at both levels. Some sclerotic changes noted along the deformities with superimposed Schmorl's nodes. Minimal retropulsion of the superior endplate fracture fragments of both levels detailed below. Additional multilevel discogenic endplate changes and facet arthropathy are noted in detailed below. Minimal arthrosis of the SI joints. Paraspinal and other soft tissues: Some mild edematous changes are noted adjacent the T11 and T12 endplate deformities. No abscess or collection. No other paraspinal fluid, gas, swelling or hemorrhage. Included portions of the posterior abdomen demonstrate extensive atherosclerotic calcification. Disc levels: Level by level evaluation of the lumbar spine below: T10-T11: Minimal T11 superior endplate retropulsion of approximately 1-2 mm. No significant spinal canal or foraminal stenosis. T11-T12: 3 mm of T12 superior endplate retropulsion as well as moderate disc height loss and vacuum disc phenomenon. Mild canal stenosis. No significant neural foraminal narrowing. T12-L1: Moderate disc height loss without significant posterior disc abnormality. No significant spinal canal or foraminal stenosis. L1-L2: Complete disc height loss with endplate spurring and mild posterior disc bulge slightly eccentric to the left extraforaminal zone. Mild canal stenosis. Effacement of the lateral recesses. Moderate  bilateral foraminal narrowing. L2-L3: Complete disc height loss with vacuum disc, endplate spurring eccentric to the right and moderate facet arthropathy. Moderate canal stenosis. Mild-to-moderate right and mild left foraminal narrowing. L3-L4: Complete disc height loss with vacuum disc, endplate spurring and disc bulge eccentric to the right resulting and some mild canal stenosis and moderate right foraminal narrowing and effacement of the right lateral recess. Left neural foramen is open. L4-L5: Complete disc height loss with posterior spurring and shallow disc bulge mild canal stenosis. Moderate left and mild right foraminal narrowing. L5-S1: Near complete disc height loss with vacuum disc and a partially calcified left central disc protrusion with endplate spurring and moderate facet arthropathy. Resulting in mild canal stenosis, effacement of the traversing left S1 nerve root, effacement of the left lateral recess and severe foraminal stenosis more moderate right foraminal narrowing  and effacement of the right lateral recess as well. IMPRESSION: 1. Age-indeterminate incomplete burst fractures involving the superior endplate of the 624THL and T12 levels (AOSpine A3) with approximately 20% height loss anteriorly at both levels and minimal retropulsion of the superior endplate fracture fragments. Some adjacent soft tissue stranding may reflect a subacute nature. Correlate for point tenderness 2. Advanced multilevel degenerative disc disease and facet arthropathy, with detailed level by level above. 3. Aortic Atherosclerosis (ICD10-I70.0). Electronically Signed   By: Lovena Le M.D.   On: 10/03/2019 19:23    ____________________________________________    PROCEDURES  Procedure(s) performed:    Procedures    Medications  oxyCODONE-acetaminophen (PERCOCET/ROXICET) 5-325 MG per tablet 1 tablet (1 tablet Oral Given 10/03/19 1722)  oxyCODONE-acetaminophen (PERCOCET/ROXICET) 5-325 MG per tablet 1 tablet  (1 tablet Oral Given 10/03/19 2011)  predniSONE (DELTASONE) tablet 60 mg (60 mg Oral Given 10/03/19 2011)  methocarbamol (ROBAXIN) tablet 500 mg (500 mg Oral Given 10/03/19 2011)     ____________________________________________   INITIAL IMPRESSION / ASSESSMENT AND PLAN / ED COURSE  Pertinent labs & imaging results that were available during my care of the patient were reviewed by me and considered in my medical decision making (see chart for details).  Review of the [place] CSRS was performed in accordance of the [place] prior to dispensing any controlled drugs.           Patient's diagnosis is consistent with acute on chronic lower back pain.  Patient presented to emergency department complaining of sharp lower back pain.  No reported recent trauma.  Patient has had ongoing issues with his lower back, sees physiatry and is scheduled to see a neurosurgeon as well.  Patient had no concerning neuro deficits.  Differential included UTI, nephrolithiasis, pyelonephritis, lumbar radiculopathy, compression fracture, ruptured disc, spinal cord compression.  Patient had MRI on file however this was several years ago.  CT scan reveals significant degenerative changes throughout the spine.  2 findings in the thoracic spine appear chronic in nature and patient is nontender in this region.  Again no neuro deficits warranting emergent MRI at this time.  No evidence for urgent neurosurgery consult.  Patient will be given steroid, muscle relaxer, Vicodin for symptom relief.  Patient already has an appointment to see neurosurgery in 1 week's time.  Patient is to keep this appointment.  Return precautions are discussed with the patient and his wife..Patient is given ED precautions to return to the ED for any worsening or new symptoms.     ____________________________________________  FINAL CLINICAL IMPRESSION(S) / ED DIAGNOSES  Final diagnoses:  Chronic midline low back pain without sciatica      NEW  MEDICATIONS STARTED DURING THIS VISIT:  ED Discharge Orders         Ordered    HYDROcodone-acetaminophen (NORCO/VICODIN) 5-325 MG tablet  Every 4 hours PRN     10/03/19 2011    predniSONE (DELTASONE) 10 MG tablet  Daily    Note to Pharmacy: Take 6 pills x 2 days, 5 pills x 2 days, 4 pills x 2 days, 3 pills x 2 days, 2 pills x 2 days, and 1 pill x 2 days   10/03/19 2011    methocarbamol (ROBAXIN) 500 MG tablet  4 times daily     10/03/19 2011              This chart was dictated using voice recognition software/Dragon. Despite best efforts to proofread, errors can occur which can change the meaning. Any  change was purely unintentional.    Darletta Moll, PA-C 10/03/19 2013    Drenda Freeze, MD 10/03/19 2259

## 2019-10-03 NOTE — ED Triage Notes (Addendum)
Pt arrived via POV with reports of progressively worsening low back pain has hx of back issues, states he had a injection before that lasted 3 months. Pt reports pain is non-radiating at this time.  Pt states the back pain is making it harder for him to walk.

## 2019-10-08 ENCOUNTER — Other Ambulatory Visit: Payer: Self-pay | Admitting: Unknown Physician Specialty

## 2019-10-08 DIAGNOSIS — M47816 Spondylosis without myelopathy or radiculopathy, lumbar region: Secondary | ICD-10-CM

## 2019-10-24 ENCOUNTER — Other Ambulatory Visit: Payer: Self-pay

## 2019-10-24 ENCOUNTER — Ambulatory Visit
Admission: RE | Admit: 2019-10-24 | Discharge: 2019-10-24 | Disposition: A | Discharge status: Home / Self Care | Payer: Medicare Other | Source: Ambulatory Visit | Attending: Unknown Physician Specialty | Admitting: Unknown Physician Specialty

## 2019-10-24 DIAGNOSIS — M47816 Spondylosis without myelopathy or radiculopathy, lumbar region: Secondary | ICD-10-CM | POA: Diagnosis not present

## 2019-10-28 ENCOUNTER — Ambulatory Visit: Payer: Medicare Other | Admitting: Dermatology

## 2020-02-08 DIAGNOSIS — M51369 Other intervertebral disc degeneration, lumbar region without mention of lumbar back pain or lower extremity pain: Secondary | ICD-10-CM | POA: Insufficient documentation

## 2020-03-02 ENCOUNTER — Ambulatory Visit: Payer: Medicare Other | Admitting: Dermatology

## 2020-03-07 ENCOUNTER — Encounter: Payer: Self-pay | Admitting: Dermatology

## 2020-03-07 ENCOUNTER — Ambulatory Visit: Payer: Medicare Other | Admitting: Dermatology

## 2020-03-07 ENCOUNTER — Other Ambulatory Visit: Payer: Self-pay

## 2020-03-07 DIAGNOSIS — Z1283 Encounter for screening for malignant neoplasm of skin: Secondary | ICD-10-CM | POA: Diagnosis not present

## 2020-03-07 DIAGNOSIS — L820 Inflamed seborrheic keratosis: Secondary | ICD-10-CM

## 2020-03-07 DIAGNOSIS — L821 Other seborrheic keratosis: Secondary | ICD-10-CM

## 2020-03-07 DIAGNOSIS — L570 Actinic keratosis: Secondary | ICD-10-CM

## 2020-03-07 DIAGNOSIS — L814 Other melanin hyperpigmentation: Secondary | ICD-10-CM

## 2020-03-07 DIAGNOSIS — D229 Melanocytic nevi, unspecified: Secondary | ICD-10-CM

## 2020-03-07 DIAGNOSIS — L578 Other skin changes due to chronic exposure to nonionizing radiation: Secondary | ICD-10-CM

## 2020-03-07 DIAGNOSIS — D1800 Hemangioma unspecified site: Secondary | ICD-10-CM

## 2020-03-07 NOTE — Patient Instructions (Signed)

## 2020-03-07 NOTE — Progress Notes (Signed)
   Follow-Up Visit   Subjective  Sean Hines is a 86 y.o. male who presents for the following: Annual Exam (History of AKs - TBSE today). The patient presents for Total-Body Skin Exam (TBSE) for skin cancer screening and mole check.  The following portions of the chart were reviewed this encounter and updated as appropriate:  Tobacco  Allergies  Meds  Problems  Med Hx  Surg Hx  Fam Hx     Review of Systems:  No other skin or systemic complaints except as noted in HPI or Assessment and Plan.  Objective  Well appearing patient in no apparent distress; mood and affect are within normal limits.  A full examination was performed including scalp, head, eyes, ears, nose, lips, neck, chest, axillae, abdomen, back, buttocks, bilateral upper extremities, bilateral lower extremities, hands, feet, fingers, toes, fingernails, and toenails. All findings within normal limits unless otherwise noted below.  Objective  Chest: Erythematous keratotic or waxy stuck-on papule or plaque.   Objective  Face, ears (12): Erythematous thin papules/macules with gritty scale.    Assessment & Plan    Lentigines - Scattered tan macules - Discussed due to sun exposure - Benign, observe - Call for any changes  Seborrheic Keratoses - Stuck-on, waxy, tan-brown papules and plaques  - Discussed benign etiology and prognosis. - Observe - Call for any changes  Melanocytic Nevi - Tan-brown and/or pink-flesh-colored symmetric macules and papules - Benign appearing on exam today - Observation - Call clinic for new or changing moles - Recommend daily use of broad spectrum spf 30+ sunscreen to sun-exposed areas.   Hemangiomas - Red papules - Discussed benign nature - Observe - Call for any changes  Actinic Damage - diffuse scaly erythematous macules with underlying dyspigmentation - Recommend daily broad spectrum sunscreen SPF 30+ to sun-exposed areas, reapply every 2 hours as needed.  - Call for  new or changing lesions.  Skin cancer screening performed today.   Inflamed seborrheic keratosis Chest  Destruction of lesion - Chest Complexity: simple   Destruction method: cryotherapy   Informed consent: discussed and consent obtained   Timeout:  patient name, date of birth, surgical site, and procedure verified Lesion destroyed using liquid nitrogen: Yes   Region frozen until ice ball extended beyond lesion: Yes   Outcome: patient tolerated procedure well with no complications   Post-procedure details: wound care instructions given    AK (actinic keratosis) (12) Face, ears  Destruction of lesion - Face, ears Complexity: simple   Destruction method: cryotherapy   Informed consent: discussed and consent obtained   Timeout:  patient name, date of birth, surgical site, and procedure verified Lesion destroyed using liquid nitrogen: Yes   Region frozen until ice ball extended beyond lesion: Yes   Outcome: patient tolerated procedure well with no complications   Post-procedure details: wound care instructions given    Return in about 6 months (around 09/05/2020) for AK follow up.   I, Ashok Cordia, CMA, am acting as scribe for Sarina Ser, MD .  Documentation: I have reviewed the above documentation for accuracy and completeness, and I agree with the above.  Sarina Ser, MD

## 2020-08-29 IMAGING — CT CT L SPINE W/O CM
3 of 4 series · 9 of 33 positions shown, 11 images · non-contrast
Comparison: Lumbar MRI 02/26/2016

CLINICAL DATA: Acute on chronic low back pain, no history of trauma

EXAM:
CT LUMBAR SPINE WITHOUT CONTRAST
TECHNIQUE: Multidetector CT imaging of the lumbar spine was performed without
intravenous contrast administration. Multiplanar CT image
reconstructions were also generated.

[Series 7: sagittal bone · sagittal · 0.25mm/px · 5 of 82 slices shown, 6 images]
[im 28/82  bone]
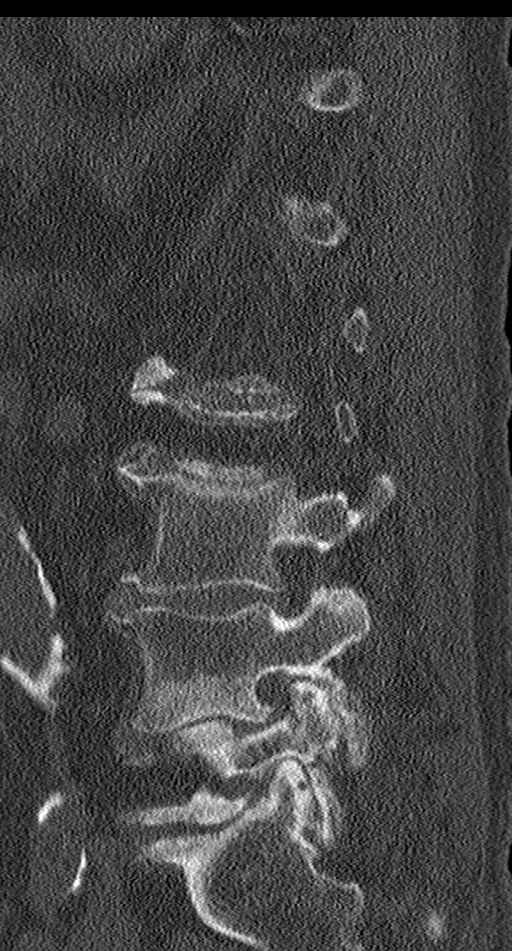
[im 34/82  bone]
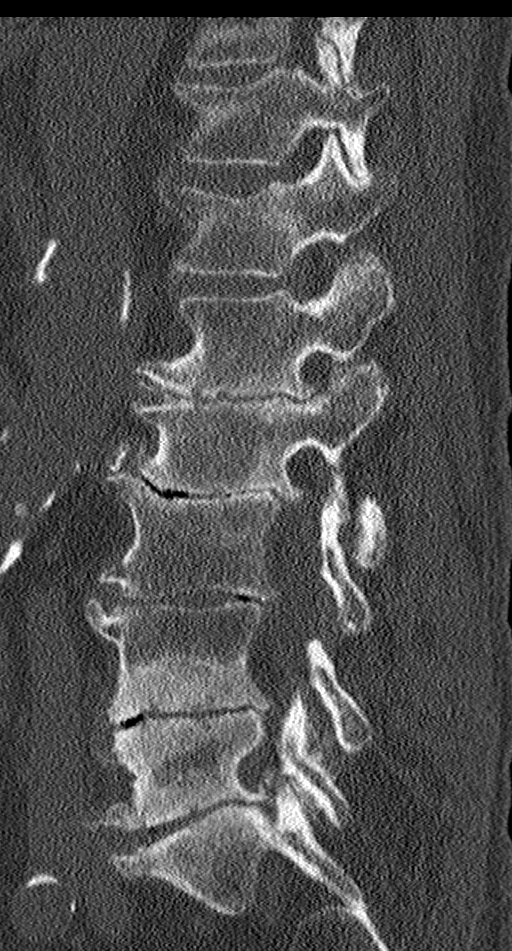
[im 41/82  soft-tissue]
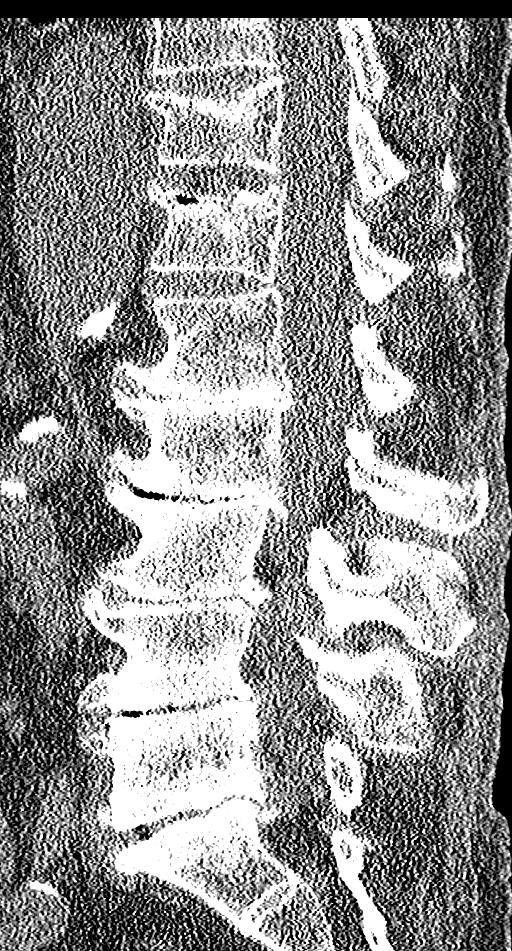
[im 41/82  bone]
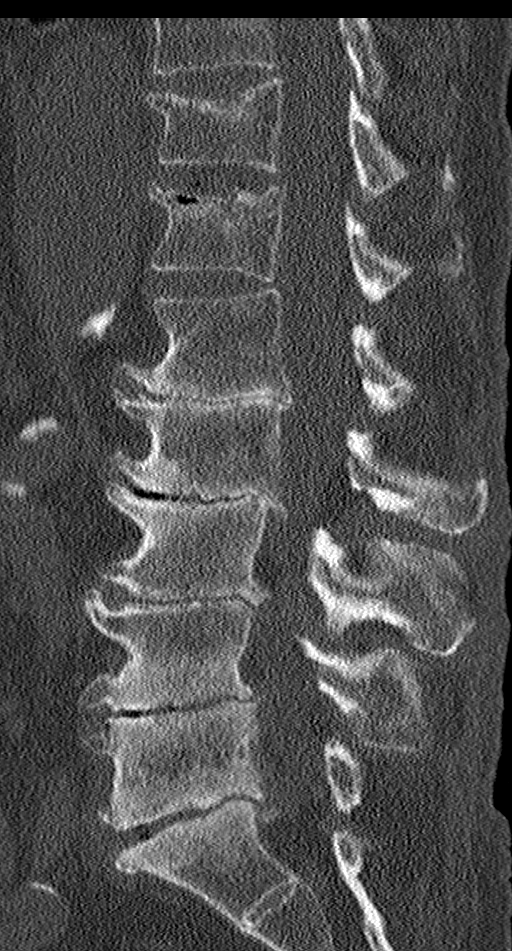
[im 48/82  bone]
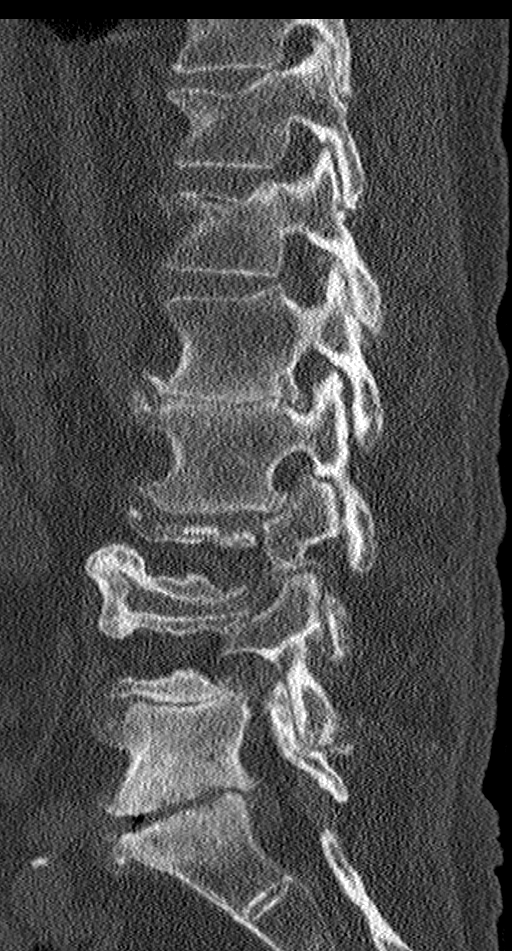
[im 55/82  bone]
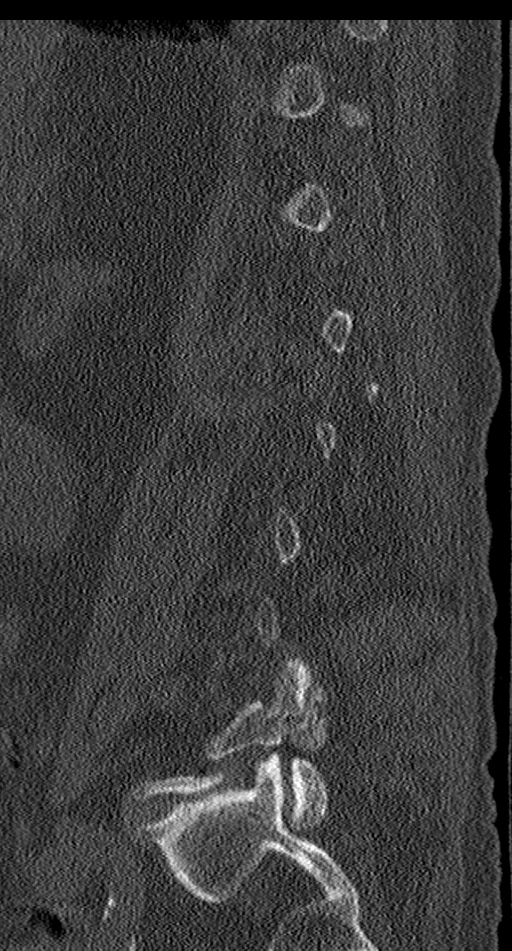

[Series 8: coronal bone · coronal · 0.32mm/px · 3 of 63 slices shown]
[im 13/63  bone]
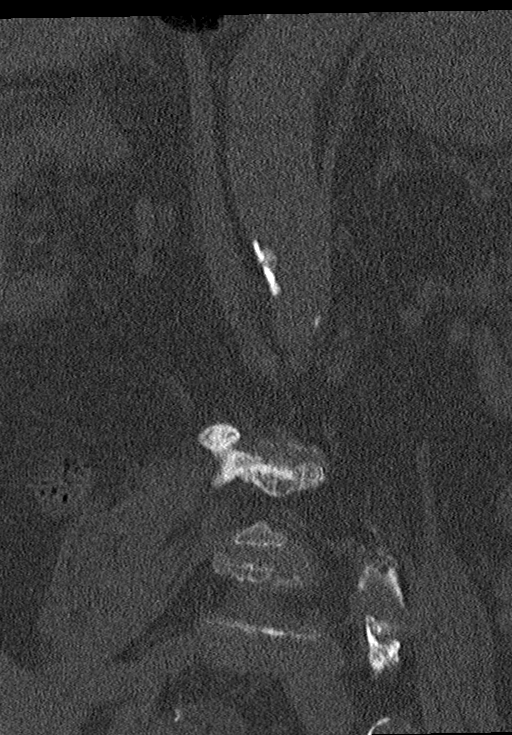
[im 25/63  bone]
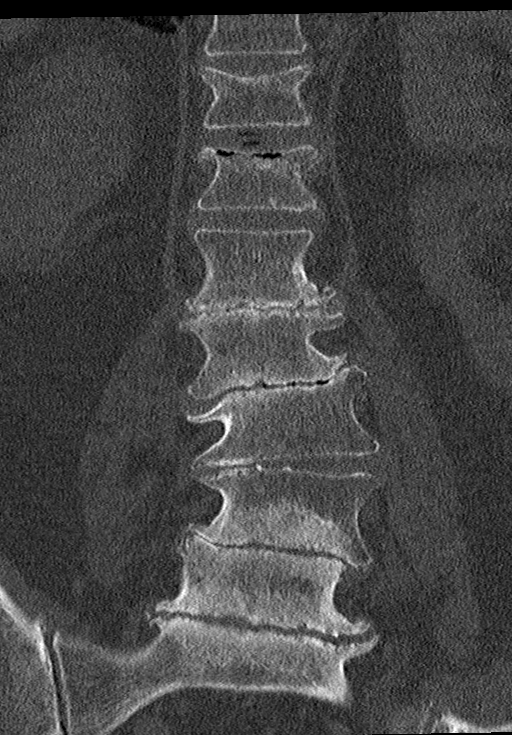
[im 38/63  bone]
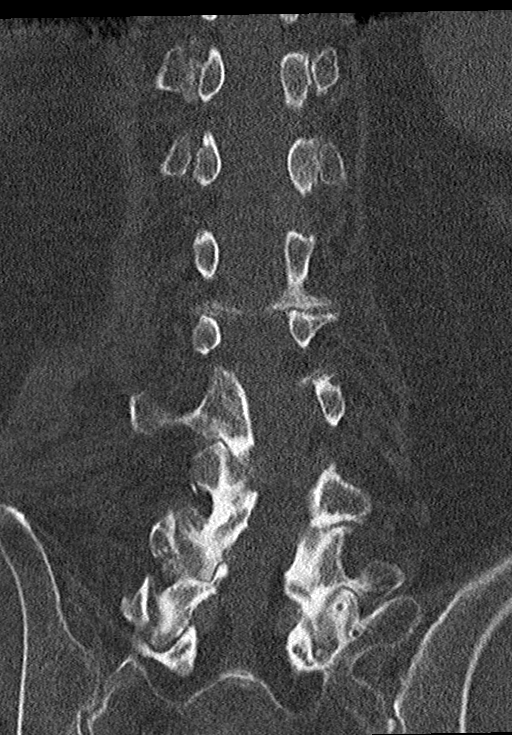

[Series 9: multi disc · axial · 0.21mm/px · z∈[-610,-610]mm · 1 of 103 slices shown, 2 images]
[im 52/103  soft-tissue]
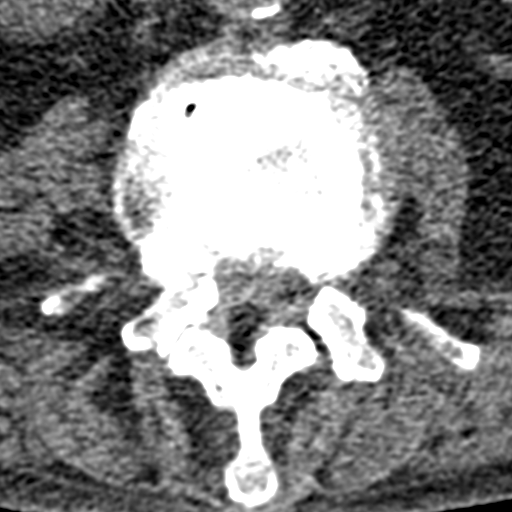
[im 52/103  bone]
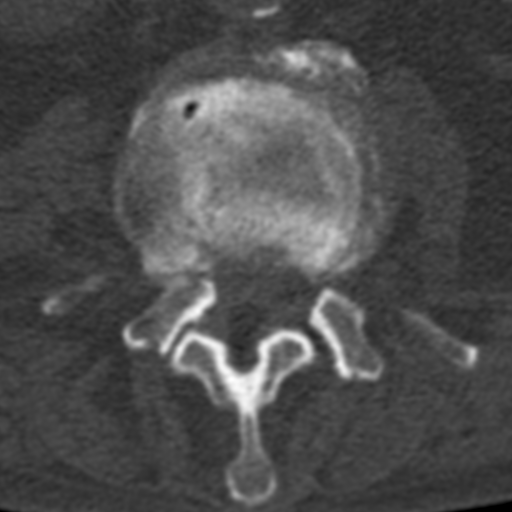

[9 of 33 positions shown; findings below may reference images not displayed]

FINDINGS: Segmentation: 5 normally formed lumbar type vertebral bodies are
seen with the lowest fully formed disc space denoted as L5-S1 in
keeping with the schema seen on prior MRI.

Alignment: Levoconvex curvature of the lumbar spine with an apex at
the L3-4 disc. Unchanged from prior exam. Unchanged mild stepwise
retrolisthesis L1-2 L3. No traumatic listhesis or spondylolysis.

Vertebrae: There are age-indeterminate incomplete burst fractures
involving the superior endplate of the T11 and T12 levels with
approximately 20% height loss anteriorly at both levels. Some
sclerotic changes noted along the deformities with superimposed
Schmorl's nodes. Minimal retropulsion of the superior endplate
fracture fragments of both levels detailed below. Additional
multilevel discogenic endplate changes and facet arthropathy are
noted in detailed below. Minimal arthrosis of the SI joints.

Paraspinal and other soft tissues: Some mild edematous changes are
noted adjacent the T11 and T12 endplate deformities. No abscess or
collection. No other paraspinal fluid, gas, swelling or hemorrhage.
Included portions of the posterior abdomen demonstrate extensive
atherosclerotic calcification.

Disc levels:

Level by level evaluation of the lumbar spine below:

T10-T11: Minimal T11 superior endplate retropulsion of approximately
1-2 mm. No significant spinal canal or foraminal stenosis.

T11-T12: 3 mm of T12 superior endplate retropulsion as well as
moderate disc height loss and vacuum disc phenomenon. Mild canal
stenosis. No significant neural foraminal narrowing.

T12-L1: Moderate disc height loss without significant posterior disc
abnormality. No significant spinal canal or foraminal stenosis.

L1-L2: Complete disc height loss with endplate spurring and mild
posterior disc bulge slightly eccentric to the left extraforaminal
zone. Mild canal stenosis. Effacement of the lateral recesses.
Moderate bilateral foraminal narrowing.

L2-L3: Complete disc height loss with vacuum disc, endplate spurring
eccentric to the right and moderate facet arthropathy. Moderate
canal stenosis. Mild-to-moderate right and mild left foraminal
narrowing.

L3-L4: Complete disc height loss with vacuum disc, endplate spurring
and disc bulge eccentric to the right resulting and some mild canal
stenosis and moderate right foraminal narrowing and effacement of
the right lateral recess. Left neural foramen is open.

L4-L5: Complete disc height loss with posterior spurring and shallow
disc bulge mild canal stenosis. Moderate left and mild right
foraminal narrowing.

L5-S1: Near complete disc height loss with vacuum disc and a
partially calcified left central disc protrusion with endplate
spurring and moderate facet arthropathy. Resulting in mild canal
stenosis, effacement of the traversing left S1 nerve root,
effacement of the left lateral recess and severe foraminal stenosis
more moderate right foraminal narrowing and effacement of the right
lateral recess as well.
IMPRESSION: 1. Age-indeterminate incomplete burst fractures involving the
superior endplate of the T11 and T12 levels (AOSpine A3) with
approximately 20% height loss anteriorly at both levels and minimal
retropulsion of the superior endplate fracture fragments. Some
adjacent soft tissue stranding may reflect a subacute nature.
Correlate for point tenderness
2. Advanced multilevel degenerative disc disease and facet
arthropathy, with detailed level by level above.
3. Aortic Atherosclerosis ([id number]).

## 2020-09-06 ENCOUNTER — Ambulatory Visit: Payer: Medicare Other | Admitting: Dermatology

## 2020-09-06 ENCOUNTER — Other Ambulatory Visit: Payer: Self-pay

## 2020-09-06 DIAGNOSIS — L821 Other seborrheic keratosis: Secondary | ICD-10-CM

## 2020-09-06 DIAGNOSIS — L578 Other skin changes due to chronic exposure to nonionizing radiation: Secondary | ICD-10-CM | POA: Diagnosis not present

## 2020-09-06 DIAGNOSIS — D692 Other nonthrombocytopenic purpura: Secondary | ICD-10-CM | POA: Diagnosis not present

## 2020-09-06 DIAGNOSIS — L820 Inflamed seborrheic keratosis: Secondary | ICD-10-CM

## 2020-09-06 DIAGNOSIS — L570 Actinic keratosis: Secondary | ICD-10-CM | POA: Diagnosis not present

## 2020-09-06 NOTE — Patient Instructions (Addendum)

## 2020-09-06 NOTE — Progress Notes (Signed)
   Follow-Up Visit   Subjective  Sean Hines is a 87 y.o. male who presents for the following: Follow-up (6 months f/u on Aks on face and ears ).  The following portions of the chart were reviewed this encounter and updated as appropriate:   Tobacco  Allergies  Meds  Problems  Med Hx  Surg Hx  Fam Hx     Review of Systems:  No other skin or systemic complaints except as noted in HPI or Assessment and Plan.  Objective  Well appearing patient in no apparent distress; mood and affect are within normal limits.  A focused examination was performed including face,scalp,arms,ears . Relevant physical exam findings are noted in the Assessment and Plan.  Objective  face, scalp x 7 (7): Erythematous thin papules/macules with gritty scale.    Assessment & Plan  AK (actinic keratosis) (7) face, scalp x 7  Destruction of lesion - face, scalp x 7 Complexity: simple   Destruction method: cryotherapy   Informed consent: discussed and consent obtained   Timeout:  patient name, date of birth, surgical site, and procedure verified Lesion destroyed using liquid nitrogen: Yes   Region frozen until ice ball extended beyond lesion: Yes   Outcome: patient tolerated procedure well with no complications   Post-procedure details: wound care instructions given    Inflamed seborrheic keratosis right temple hairline  Destruction of lesion - right temple hairline Complexity: simple   Destruction method: cryotherapy   Informed consent: discussed and consent obtained   Timeout:  patient name, date of birth, surgical site, and procedure verified Lesion destroyed using liquid nitrogen: Yes   Region frozen until ice ball extended beyond lesion: Yes   Outcome: patient tolerated procedure well with no complications   Post-procedure details: wound care instructions given     Actinic Damage - chronic, secondary to cumulative UV radiation exposure/sun exposure over time - diffuse scaly erythematous  macules with underlying dyspigmentation - Recommend daily broad spectrum sunscreen SPF 30+ to sun-exposed areas, reapply every 2 hours as needed.  - Recommend staying in the shade or wearing long sleeves, sun glasses (UVA+UVB protection) and wide brim hats (4-inch brim around the entire circumference of the hat). - Call for new or changing lesions.  Purpura - Chronic; persistent and recurrent.  Treatable, but not curable. - Violaceous macules and patches - Benign - Related to trauma, age, sun damage and/or use of blood thinners, chronic use of topical and/or oral steroids - Observe - Can use OTC arnica containing moisturizer such as Dermend Bruise Formula if desired - Call for worsening or other concerns  Seborrheic Keratoses - Stuck-on, waxy, tan-brown papules and/or plaques  - Benign-appearing - Discussed benign etiology and prognosis. - Observe - Call for any changes  Return in about 6 months (around 03/08/2021) for TBSE, hx of AKS .  IMarye Round, CMA, am acting as scribe for Sarina Ser, MD .  Documentation: I have reviewed the above documentation for accuracy and completeness, and I agree with the above.  Sarina Ser, MD

## 2020-09-10 ENCOUNTER — Encounter: Payer: Self-pay | Admitting: Dermatology

## 2021-02-28 ENCOUNTER — Encounter: Payer: Self-pay | Admitting: Dermatology

## 2021-02-28 ENCOUNTER — Other Ambulatory Visit: Payer: Self-pay

## 2021-02-28 ENCOUNTER — Ambulatory Visit: Payer: Medicare Other | Admitting: Dermatology

## 2021-02-28 DIAGNOSIS — L570 Actinic keratosis: Secondary | ICD-10-CM

## 2021-02-28 DIAGNOSIS — L821 Other seborrheic keratosis: Secondary | ICD-10-CM

## 2021-02-28 DIAGNOSIS — D229 Melanocytic nevi, unspecified: Secondary | ICD-10-CM | POA: Diagnosis not present

## 2021-02-28 DIAGNOSIS — L820 Inflamed seborrheic keratosis: Secondary | ICD-10-CM | POA: Diagnosis not present

## 2021-02-28 DIAGNOSIS — L814 Other melanin hyperpigmentation: Secondary | ICD-10-CM

## 2021-02-28 DIAGNOSIS — D692 Other nonthrombocytopenic purpura: Secondary | ICD-10-CM

## 2021-02-28 DIAGNOSIS — Z1283 Encounter for screening for malignant neoplasm of skin: Secondary | ICD-10-CM

## 2021-02-28 DIAGNOSIS — D1800 Hemangioma unspecified site: Secondary | ICD-10-CM

## 2021-02-28 DIAGNOSIS — L578 Other skin changes due to chronic exposure to nonionizing radiation: Secondary | ICD-10-CM

## 2021-02-28 NOTE — Patient Instructions (Addendum)

## 2021-02-28 NOTE — Progress Notes (Signed)
Follow-Up Visit   Subjective  Sean Hines is a 87 y.o. male who presents for the following: Annual Exam (History of AK - TBSE today). The patient presents for Total-Body Skin Exam (TBSE) for skin cancer screening and mole check.  The following portions of the chart were reviewed this encounter and updated as appropriate:   Tobacco  Allergies  Meds  Problems  Med Hx  Surg Hx  Fam Hx     Review of Systems:  No other skin or systemic complaints except as noted in HPI or Assessment and Plan.  Objective  Well appearing patient in no apparent distress; mood and affect are within normal limits.  A full examination was performed including scalp, head, eyes, ears, nose, lips, neck, chest, axillae, abdomen, back, buttocks, bilateral upper extremities, bilateral lower extremities, hands, feet, fingers, toes, fingernails, and toenails. All findings within normal limits unless otherwise noted below.  Right clavicle x 1 (7) Erythematous keratotic or waxy stuck-on papule or plaque.   Face/ears (7) Erythematous thin papules/macules with gritty scale.    Assessment & Plan   Purpura - Chronic; persistent and recurrent.  Treatable, but not curable. - Violaceous macules and patches - Benign - Related to trauma, age, sun damage and/or use of blood thinners, chronic use of topical and/or oral steroids - Observe - Can use OTC arnica containing moisturizer such as Dermend Bruise Formula if desired - Call for worsening or other concerns  Lentigines - Scattered tan macules - Due to sun exposure - Benign-appearing, observe - Recommend daily broad spectrum sunscreen SPF 30+ to sun-exposed areas, reapply every 2 hours as needed. - Call for any changes  Seborrheic Keratoses - Stuck-on, waxy, tan-brown papules and/or plaques  - Benign-appearing - Discussed benign etiology and prognosis. - Observe - Call for any changes  Melanocytic Nevi - Tan-brown and/or pink-flesh-colored symmetric  macules and papules - Benign appearing on exam today - Observation - Call clinic for new or changing moles - Recommend daily use of broad spectrum spf 30+ sunscreen to sun-exposed areas.   Hemangiomas - Red papules - Discussed benign nature - Observe - Call for any changes  Actinic Damage - Chronic condition, secondary to cumulative UV/sun exposure - diffuse scaly erythematous macules with underlying dyspigmentation - Recommend daily broad spectrum sunscreen SPF 30+ to sun-exposed areas, reapply every 2 hours as needed.  - Staying in the shade or wearing long sleeves, sun glasses (UVA+UVB protection) and wide brim hats (4-inch brim around the entire circumference of the hat) are also recommended for sun protection.  - Call for new or changing lesions.  Skin cancer screening performed today.  Inflamed seborrheic keratosis Right clavicle x 1  Destruction of lesion - Right clavicle x 1 Complexity: simple   Destruction method: cryotherapy   Informed consent: discussed and consent obtained   Timeout:  patient name, date of birth, surgical site, and procedure verified Lesion destroyed using liquid nitrogen: Yes   Region frozen until ice ball extended beyond lesion: Yes   Outcome: patient tolerated procedure well with no complications   Post-procedure details: wound care instructions given    AK (actinic keratosis) (7) Face/ears  Destruction of lesion - Face/ears Complexity: simple   Destruction method: cryotherapy   Informed consent: discussed and consent obtained   Timeout:  patient name, date of birth, surgical site, and procedure verified Lesion destroyed using liquid nitrogen: Yes   Region frozen until ice ball extended beyond lesion: Yes   Outcome: patient tolerated procedure well with  no complications   Post-procedure details: wound care instructions given    Skin cancer screening  Return in about 6 months (around 08/29/2021) for sun exposed areas.  I, Ashok Cordia,  CMA, am acting as scribe for Sarina Ser, MD . Documentation: I have reviewed the above documentation for accuracy and completeness, and I agree with the above.  Sarina Ser, MD

## 2021-04-23 ENCOUNTER — Ambulatory Visit: Payer: Medicare Other | Admitting: Dermatology

## 2021-04-23 ENCOUNTER — Other Ambulatory Visit: Payer: Self-pay

## 2021-04-23 DIAGNOSIS — L570 Actinic keratosis: Secondary | ICD-10-CM

## 2021-04-23 DIAGNOSIS — L820 Inflamed seborrheic keratosis: Secondary | ICD-10-CM | POA: Diagnosis not present

## 2021-04-23 DIAGNOSIS — L578 Other skin changes due to chronic exposure to nonionizing radiation: Secondary | ICD-10-CM

## 2021-04-23 DIAGNOSIS — L821 Other seborrheic keratosis: Secondary | ICD-10-CM

## 2021-04-23 NOTE — Patient Instructions (Signed)

## 2021-04-23 NOTE — Progress Notes (Signed)
   Follow-Up Visit   Subjective  Sean Hines is a 87 y.o. male who presents for the following: sensitive lesion (On the forehead - has been there for a few weeks. Patient is concerned and would like it checked.). The patient has spots, moles and lesions to be evaluated, some may be new or changing and the patient has concerns that these could be cancer.  The following portions of the chart were reviewed this encounter and updated as appropriate:   Tobacco  Allergies  Meds  Problems  Med Hx  Surg Hx  Fam Hx     Review of Systems:  No other skin or systemic complaints except as noted in HPI or Assessment and Plan.  Objective  Well appearing patient in no apparent distress; mood and affect are within normal limits.  A focused examination was performed including the face. Relevant physical exam findings are noted in the Assessment and Plan.  Face and ears x 10 (10) Erythematous thin papules/macules with gritty scale.   Face and chest x 3 (3) Erythematous keratotic or waxy stuck-on papule or plaque.    Assessment & Plan  AK (actinic keratosis) (10) Face and ears x 10  Destruction of lesion - Face and ears x 10 Complexity: simple   Destruction method: cryotherapy   Informed consent: discussed and consent obtained   Timeout:  patient name, date of birth, surgical site, and procedure verified Lesion destroyed using liquid nitrogen: Yes   Region frozen until ice ball extended beyond lesion: Yes   Outcome: patient tolerated procedure well with no complications   Post-procedure details: wound care instructions given    Inflamed seborrheic keratosis Face and chest x 3  Destruction of lesion - Face and chest x 3 Complexity: simple   Destruction method: cryotherapy   Informed consent: discussed and consent obtained   Timeout:  patient name, date of birth, surgical site, and procedure verified Lesion destroyed using liquid nitrogen: Yes   Region frozen until ice ball extended  beyond lesion: Yes   Outcome: patient tolerated procedure well with no complications   Post-procedure details: wound care instructions given    Actinic Damage - chronic, secondary to cumulative UV radiation exposure/sun exposure over time - diffuse scaly erythematous macules with underlying dyspigmentation - Recommend daily broad spectrum sunscreen SPF 30+ to sun-exposed areas, reapply every 2 hours as needed.  - Recommend staying in the shade or wearing long sleeves, sun glasses (UVA+UVB protection) and wide brim hats (4-inch brim around the entire circumference of the hat). - Call for new or changing lesions.  Seborrheic Keratoses - Stuck-on, waxy, tan-brown papules and/or plaques  - Benign-appearing - Discussed benign etiology and prognosis. - Observe - Call for any changes  Return for appointment as scheduled.  Luther Redo, CMA, am acting as scribe for Sarina Ser, MD . Documentation: I have reviewed the above documentation for accuracy and completeness, and I agree with the above.  Sarina Ser, MD

## 2021-05-02 ENCOUNTER — Encounter: Payer: Self-pay | Admitting: Dermatology

## 2021-08-02 DIAGNOSIS — I491 Atrial premature depolarization: Secondary | ICD-10-CM | POA: Insufficient documentation

## 2021-08-13 ENCOUNTER — Other Ambulatory Visit: Payer: Self-pay

## 2021-08-13 ENCOUNTER — Ambulatory Visit: Payer: Medicare Other | Admitting: Urology

## 2021-08-13 ENCOUNTER — Encounter: Payer: Self-pay | Admitting: Urology

## 2021-08-13 VITALS — BP 111/64 | HR 93 | Ht 65.0 in | Wt 130.0 lb

## 2021-08-13 DIAGNOSIS — R972 Elevated prostate specific antigen [PSA]: Secondary | ICD-10-CM | POA: Diagnosis not present

## 2021-08-13 NOTE — Progress Notes (Signed)
? ?08/13/21 ?10:20 AM  ? ?Sean Hines ?[birth year] ?[id number] ? ?CC: Elevated PSA ? ?HPI: ?88 year old male referred for an elevated PSA of 11, which has slowly increased over the last 5 years from 3.0 in 2018.  He has chronic back pain and has had multiple MRIs and CTs over the last few years that showed no evidence of metastatic disease, and degenerative disc disease associated with aging.  He denies any urinary symptoms.  He denies any weight loss or flank pain. ? ? ?PMH: ?Past Medical History:  ?Diagnosis Date  ? Actinic keratosis   ? GERD (gastroesophageal reflux disease)   ? Hx of exercise stress test 2016  ? normal per pt  ? Hyperlipidemia   ? Hypertension   ? ? ?Surgical History: ?Past Surgical History:  ?Procedure Laterality Date  ? EXCISION PARTIAL PHALANX Left 02/19/2018  ? Procedure: EXCISION PARTIAL PHALANX-2ND TOE;  Surgeon: Albertine Patricia, DPM;  Location: [facility];  Service: Podiatry;  Laterality: Left;  IVA WITH LOCAL  ? hand surgery Right   ? HARDWARE REMOVAL Right 02/07/2016  ? Procedure: Hardware removal, excision of osteophyte, and debridement of gouty tophus, right index DIP joint.;  Surgeon: Corky Mull, MD;  Location: [facility];  Service: Orthopedics;  Laterality: Right;  ? KNEE ARTHROSCOPY Right 08/2011  ? REPLACEMENT TOTAL KNEE Right 2013  ? ? ? ?Family History: ?No family history on file. ? ?Social History:  reports that he has never smoked. He has never used smokeless tobacco. He reports current alcohol use of about 7.0 standard drinks per week. No history on file for drug use. ? ?Physical Exam: ?BP 111/64 (BP Location: Left Arm, Patient Position: Sitting, Cuff Size: Normal)   Pulse 93   Ht '5\' 5"'$  (1.651 m)   Wt 130 lb (59 kg)   BMI 21.63 kg/m?   ? ?Constitutional:  Alert and oriented, No acute distress. ?Cardiovascular: No clubbing, cyanosis, or edema. ?Respiratory: Normal respiratory effort, no increased work of breathing. ?GI: Abdomen is soft, nontender,  nondistended, no abdominal masses ?DRE: 40 g, smooth, slight firmness on the right lateral lobe ? ?Laboratory Data: ?Reviewed, see HPI ? ?Pertinent Imaging: ?I personally reviewed and interpreted the lumbar spine imaging from 2021 that shows no evidence of metastatic disease ? ?Assessment & Plan:   ?88 year old male with a persistently rising PSA over the last 5 years from 3 in 2018 to 24 in March 2023.  DRE with some slight subtle firmness on the right side.  We had a very long conversation about PSA, and the AUA guidelines that do not recommend routine screening for prostate cancer in men over age 106.  We discussed his PSA trend is most consistent with a slow-growing prostate cancer, and that numerous options are available.  From a least to most invasive we discussed watchful waiting with PSA monitoring, staging imaging, consideration of prostate MRI(not option for him with his artificial knee), or prostate biopsy.  Risks and benefits of biopsy discussed at length including bleeding, infection, sepsis, and even death.  We also discussed that treatments for prostate cancer can cause significant impact on quality of life, and that there is not good data that even androgen deprivation therapy improves overall survival.  After extensive conversation using shared decision making, he opted for more of a watchful waiting approach with a repeat PSA in 9 months.  He understands that his PSA trend likely indicates prostate cancer, and that further work-up or evaluation may cause impact on his quality  of life without significant increase in his overall survival.  He understands the risk of developing metastatic and potentially lethal prostate cancer, and opts to pursue watchful waiting.  This is not unreasonable with his age and PSA trend. ? ?RTC 9 months PSA prior ?Re-discuss staging imaging with CT or bone scan at next visit if PSA rises significantly ? ?Nickolas Madrid, MD ?08/13/2021 ? ?[facility] ?[address], [address] ?[place], [place] [address] ?[phone] ? ?

## 2021-08-13 NOTE — Patient Instructions (Signed)
Prostate Cancer Screening ?Prostate cancer screening is testing that is done to check for the presence of prostate cancer in men. The prostate gland is a walnut-sized gland that is located below the bladder and in front of the rectum in males. The function of the prostate is to add fluid to semen during ejaculation. Prostate cancer is one of the most common types of cancer in men. ?Who should have prostate cancer screening? ?Screening recommendations vary based on age and other risk factors, as well as between the professional organizations who make the recommendations. ?In general, screening is recommended if: ?You are age 50 to 70 and have an average risk for prostate cancer. You should talk with your health care provider about your need for screening and how often screening should be done. Because most prostate cancers are slow growing and will not cause death, screening in this age group is generally reserved for men who have a 10- to 15-year life expectancy. ?You are younger than age 50, and you have these risk factors: ?Having a father, brother, or uncle who has been diagnosed with prostate cancer. The risk is higher if your family member's cancer occurred at an early age or if you have multiple family members with prostate cancer at an early age. ?Being a male who is Black or is of Caribbean or sub-Saharan African descent. ?In general, screening is not recommended if: ?You are younger than age 40. ?You are between the ages of 40 and 49 and you have no risk factors. ?You are 70 years of age or older. At this age, the risks that screening can cause are greater than the benefits that it may provide. ?If you are at high risk for prostate cancer, your health care provider may recommend that you have screenings more often or that you start screening at a younger age. ?How is screening for prostate cancer done? ?The recommended prostate cancer screening test is a blood test called the prostate-specific antigen (PSA)  test. PSA is a protein that is made in the prostate. As you age, your prostate naturally produces more PSA. Abnormally high PSA levels may be caused by: ?Prostate cancer. ?An enlarged prostate that is not caused by cancer (benign prostatic hyperplasia, or BPH). This condition is very common in older men. ?A prostate gland infection (prostatitis) or urinary tract infection. ?Certain medicines such as male hormones (like testosterone) or other medicines that raise testosterone levels. ?A rectal exam may be done as part of prostate cancer screening to help provide information about the size of your prostate gland. When a rectal exam is performed, it should be done after the PSA level is drawn to avoid any effect on the results. ?Depending on the PSA results, you may need more tests, such as: ?A physical exam to check the size of your prostate gland, if not done as part of screening. ?Blood and imaging tests. ?A procedure to remove tissue samples from your prostate gland for testing (biopsy). This is the only way to know for certain if you have prostate cancer. ?What are the benefits of prostate cancer screening? ?Screening can help to identify cancer at an early stage, before symptoms start and when the cancer can be treated more easily. ?There is a small chance that screening may lower your risk of dying from prostate cancer. The chance is small because prostate cancer is a slow-growing cancer, and most men with prostate cancer die from a different cause. ?What are the risks of prostate cancer screening? ?The main   risk of prostate cancer screening is diagnosing and treating prostate cancer that would never have caused any symptoms or problems. This is called overdiagnosisand overtreatment. PSA screening cannot tell you if your PSA is high due to cancer or a different cause. A prostate biopsy is the only procedure to diagnose prostate cancer. Even the results of a biopsy may not tell you if your cancer needs to be  treated. Slow-growing prostate cancer may not need any treatment other than monitoring, so diagnosing and treating it may cause unnecessary stress or other side effects. ?Questions to ask your health care provider ?When should I start prostate cancer screening? ?What is my risk for prostate cancer? ?How often do I need screening? ?What type of screening tests do I need? ?How do I get my test results? ?What do my results mean? ?Do I need treatment? ?Where to find more information ?The American Cancer Society: www.cancer.org ?American Urological Association: www.auanet.org ?Contact a health care provider if: ?You have difficulty urinating. ?You have pain when you urinate or ejaculate. ?You have blood in your urine or semen. ?You have pain in your back or in the area of your prostate. ?Summary ?Prostate cancer is a common type of cancer in men. The prostate gland is located below the bladder and in front of the rectum. This gland adds fluid to semen during ejaculation. ?Prostate cancer screening may identify cancer at an early stage, when the cancer can be treated more easily and is less likely to have spread to other areas of the body. ?The prostate-specific antigen (PSA) test is the recommended screening test for prostate cancer, but it has associated risks. ?Discuss the risks and benefits of prostate cancer screening with your health care provider. If you are age 70 or older, the risks that screening can cause are greater than the benefits that it may provide. ?This information is not intended to replace advice given to you by your health care provider. Make sure you discuss any questions you have with your health care provider. ?Document Revised: 10/30/2020 Document Reviewed: 10/30/2020 ?Elsevier Patient Education ? 2022 [employer] ? ?

## 2021-09-10 ENCOUNTER — Ambulatory Visit: Payer: Medicare Other | Admitting: Dermatology

## 2021-09-10 DIAGNOSIS — B356 Tinea cruris: Secondary | ICD-10-CM

## 2021-09-10 DIAGNOSIS — L578 Other skin changes due to chronic exposure to nonionizing radiation: Secondary | ICD-10-CM

## 2021-09-10 DIAGNOSIS — Z1283 Encounter for screening for malignant neoplasm of skin: Secondary | ICD-10-CM

## 2021-09-10 DIAGNOSIS — L821 Other seborrheic keratosis: Secondary | ICD-10-CM

## 2021-09-10 DIAGNOSIS — L814 Other melanin hyperpigmentation: Secondary | ICD-10-CM

## 2021-09-10 DIAGNOSIS — L720 Epidermal cyst: Secondary | ICD-10-CM | POA: Diagnosis not present

## 2021-09-10 DIAGNOSIS — D692 Other nonthrombocytopenic purpura: Secondary | ICD-10-CM

## 2021-09-10 DIAGNOSIS — L570 Actinic keratosis: Secondary | ICD-10-CM | POA: Diagnosis not present

## 2021-09-10 DIAGNOSIS — L820 Inflamed seborrheic keratosis: Secondary | ICD-10-CM

## 2021-09-10 DIAGNOSIS — D229 Melanocytic nevi, unspecified: Secondary | ICD-10-CM

## 2021-09-10 MED ORDER — KETOCONAZOLE 2 % EX CREA: 1.0000 "application " | TOPICAL_CREAM | Freq: Every day | CUTANEOUS | 10 refills | Status: AC

## 2021-09-10 NOTE — Progress Notes (Signed)
? ?Follow-Up Visit ?  ?Subjective  ?Sean Hines is a 88 y.o. male who presents for the following: Actinic Keratosis (6 month ak,  spot at right hip, chest, left chest, and face ). ?The patient presents for Upper Body Skin Exam (UBSE) for skin cancer screening and mole check.  The patient has spots, moles and lesions to be evaluated, some may be new or changing and the patient has concerns that these could be cancer. ? ?The following portions of the chart were reviewed this encounter and updated as appropriate:  Tobacco  Allergies  Meds  Problems  Med Hx  Surg Hx  Fam Hx   ?  ?Review of Systems: No other skin or systemic complaints except as noted in HPI or Assessment and Plan. ? ?Objective  ?Well appearing patient in no apparent distress; mood and affect are within normal limits. ? ?All skin waist up examined. ? ?scalp face and ears x 15 (15) ?Erythematous thin papules/macules with gritty scale.  ? ?b/l arms and hands x 6, chest / trunk x 4 (10) ?Erythematous stuck-on, waxy papule or plaque ? ?right groin ?1 cm Subcutaneous nodule at right groin ? ? ?Assessment & Plan  ?Actinic keratosis (15) ?scalp face and ears x 15 ?Actinic keratoses are precancerous spots that appear secondary to cumulative UV radiation exposure/sun exposure over time. They are chronic with expected duration over 1 year. A portion of actinic keratoses will progress to squamous cell carcinoma of the skin. It is not possible to reliably predict which spots will progress to skin cancer and so treatment is recommended to prevent development of skin cancer. ? ?Recommend daily broad spectrum sunscreen SPF 30+ to sun-exposed areas, reapply every 2 hours as needed.  ?Recommend staying in the shade or wearing long sleeves, sun glasses (UVA+UVB protection) and wide brim hats (4-inch brim around the entire circumference of the hat). ?Call for new or changing lesions. ? ?Destruction of lesion - scalp face and ears x 15 ?Complexity: simple    ?Destruction method: cryotherapy   ?Informed consent: discussed and consent obtained   ?Timeout:  patient name, date of birth, surgical site, and procedure verified ?Lesion destroyed using liquid nitrogen: Yes   ?Region frozen until ice ball extended beyond lesion: Yes   ?Outcome: patient tolerated procedure well with no complications   ?Post-procedure details: wound care instructions given   ? ?Inflamed seborrheic keratosis (10) ?b/l arms and hands x 6, chest / trunk x 4 ?Irritated and bothers patient ?Destruction of lesion - b/l arms and hands x 6, chest / trunk x 4 ?Complexity: simple   ?Destruction method: cryotherapy   ?Informed consent: discussed and consent obtained   ?Timeout:  patient name, date of birth, surgical site, and procedure verified ?Lesion destroyed using liquid nitrogen: Yes   ?Region frozen until ice ball extended beyond lesion: Yes   ?Outcome: patient tolerated procedure well with no complications   ?Post-procedure details: wound care instructions given   ? ?Tinea cruris ?groin ?Chronic and persistent condition with duration or expected duration over one year. Condition is symptomatic / bothersome to patient. Not to goal. ?Start Ketoconazole cream 2 % apply qd to groin  ? ?ketoconazole (NIZORAL) 2 % cream - groin ?Apply 1 application. topically daily. Apply to groin for rash ? ?Epidermoid cyst of skin ?right groin ?Cyst with symptoms and/or recent change.  Discussed surgical excision to remove, including resulting scar and possible recurrence.  Patient will schedule for surgery. Pre-op information given. ? ?Skin cancer screening ? ?  Seborrheic Keratoses ?- Stuck-on, waxy, tan-brown papules and/or plaques  ?- Benign-appearing ?- Discussed benign etiology and prognosis. ?- Observe ?- Call for any changes ? ?Lentigines ?- Scattered tan macules ?- Due to sun exposure ?- Benign-appering, observe ?- Recommend daily broad spectrum sunscreen SPF 30+ to sun-exposed areas, reapply every 2 hours as  needed. ?- Call for any changes ? ?Purpura - Chronic; persistent and recurrent.  Treatable, but not curable. ?- Violaceous macules and patches ?- Benign ?- Related to trauma, age, sun damage and/or use of blood thinners, chronic use of topical and/or oral steroids ?- Observe ?- Can use OTC arnica containing moisturizer such as Dermend Bruise Formula if desired ?- Call for worsening or other concerns ? ?Melanocytic Nevi ?- Tan-brown and/or pink-flesh-colored symmetric macules and papules ?- Benign appearing on exam today ?- Observation ?- Call clinic for new or changing moles ?- Recommend daily use of broad spectrum spf 30+ sunscreen to sun-exposed areas.  ? ?Actinic Damage ?- chronic, secondary to cumulative UV radiation exposure/sun exposure over time ?- diffuse scaly erythematous macules with underlying dyspigmentation ?- Recommend daily broad spectrum sunscreen SPF 30+ to sun-exposed areas, reapply every 2 hours as needed.  ?- Recommend staying in the shade or wearing long sleeves, sun glasses (UVA+UVB protection) and wide brim hats (4-inch brim around the entire circumference of the hat). ?- Call for new or changing lesions. ? ?Return in about 6 months (around 03/12/2022) for ak followup, recheck tinea cruris . ?Garry Heater, CMA, am acting as scribe for Sarina Ser, MD. ?Documentation: I have reviewed the above documentation for accuracy and completeness, and I agree with the above. ? ?Sarina Ser, MD ? ?

## 2021-09-10 NOTE — Patient Instructions (Addendum)
Actinic keratoses are precancerous spots that appear secondary to cumulative UV radiation exposure/sun exposure over time. They are chronic with expected duration over 1 year. A portion of actinic keratoses will progress to squamous cell carcinoma of the skin. It is not possible to reliably predict which spots will progress to skin cancer and so treatment is recommended to prevent development of skin cancer. ? ?Recommend daily broad spectrum sunscreen SPF 30+ to sun-exposed areas, reapply every 2 hours as needed.  ?Recommend staying in the shade or wearing long sleeves, sun glasses (UVA+UVB protection) and wide brim hats (4-inch brim around the entire circumference of the hat). ?Call for new or changing lesions.  ? ?Cryotherapy Aftercare ? ?Wash gently with soap and water everyday.   ?Apply Vaseline and Band-Aid daily until healed.  ? ? ?Seborrheic Keratosis ? ?What causes seborrheic keratoses? ?Seborrheic keratoses are harmless, common skin growths that first appear during adult life.  As time goes by, more growths appear.  Some people may develop a large number of them.  Seborrheic keratoses appear on both covered and uncovered body parts.  They are not caused by sunlight.  The tendency to develop seborrheic keratoses can be inherited.  They vary in color from skin-colored to gray, brown, or even black.  They can be either smooth or have a rough, warty surface.   ?Seborrheic keratoses are superficial and look as if they were stuck on the skin.  Under the microscope this type of keratosis looks like layers upon layers of skin.  That is why at times the top layer may seem to fall off, but the rest of the growth remains and re-grows.   ? ?Treatment ?Seborrheic keratoses do not need to be treated, but can easily be removed in the office.  Seborrheic keratoses often cause symptoms when they rub on clothing or jewelry.  Lesions can be in the way of shaving.  If they become inflamed, they can cause itching, soreness, or  burning.  Removal of a seborrheic keratosis can be accomplished by freezing, burning, or surgery. ?If any spot bleeds, scabs, or grows rapidly, please return to have it checked, as these can be an indication of a skin cancer. ? ? ? ?If You Need Anything After Your Visit ? ?If you have any questions or concerns for your doctor, please call our main line at [phone] and press option 4 to reach your doctor's medical assistant. If no one answers, please leave a voicemail as directed and we will return your call as soon as possible. Messages left after 4 pm will be answered the following business day.  ? ?You may also send [place] a message via MyChart. We typically respond to MyChart messages within 1-2 business days. ? ?For prescription refills, please ask your pharmacy to contact our office. Our fax number is [phone]. ? ?If you have an urgent issue when the clinic is closed that cannot wait until the next business day, you can page your doctor at the number below.   ? ?Please note that while we do our best to be available for urgent issues outside of office hours, we are not available 24/7.  ? ?If you have an urgent issue and are unable to reach [place], you may choose to seek medical care at your doctor's office, retail clinic, urgent care center, or emergency room. ? ?If you have a medical emergency, please immediately call 911 or go to the emergency department. ? ?Pager Numbers ? ?- Dr. Nehemiah Massed: [phone] ? ?- Dr.  Moye: [phone] ? ?- Dr. Nicole Kindred: [phone] ? ?In the event of inclement weather, please call our main line at [phone] for an update on the status of any delays or closures. ? ?Dermatology Medication Tips: ?Please keep the boxes that topical medications come in in order to help keep track of the instructions about where and how to use these. Pharmacies typically print the medication instructions only on the boxes and not directly on the medication tubes.  ? ?If your medication is too  expensive, please contact our office at [phone] option 4 or send [place] a message through [place].  ? ?We are unable to tell what your co-pay for medications will be in advance as this is different depending on your insurance coverage. However, we may be able to find a substitute medication at lower cost or fill out paperwork to get insurance to cover a needed medication.  ? ?If a prior authorization is required to get your medication covered by your insurance company, please allow [place] 1-2 business days to complete this process. ? ?Drug prices often vary depending on where the prescription is filled and some pharmacies may offer cheaper prices. ? ?The website www.goodrx.com contains coupons for medications through different pharmacies. The prices here do not account for what the cost may be with help from insurance (it may be cheaper with your insurance), but the website can give you the price if you did not use any insurance.  ?- You can print the associated coupon and take it with your prescription to the pharmacy.  ?- You may also stop by our office during regular business hours and pick up a GoodRx coupon card.  ?- If you need your prescription sent electronically to a different pharmacy, notify our office through [facility] or by phone at [phone] option 4. ? ? ? ? ?Si Usted Necesita Algo Despu?s de Su Visita ? ?Tambi?n puede enviarnos un mensaje a trav?s de MyChart. Por lo general respondemos a los mensajes de MyChart en el transcurso de 1 a 2 d?as h?biles. ? ?Para renovar recetas, por favor pida a su farmacia que se ponga en contacto con nuestra oficina. Nuestro n?mero de fax es el [phone]. ? ?Si tiene un asunto urgente cuando la cl?nica est? cerrada y que no puede esperar hasta el siguiente d?a h?bil, puede llamar/localizar a su doctor(a) al n?mero que aparece a continuaci?n.  ? ?Por favor, tenga en cuenta que aunque hacemos todo lo posible para estar disponibles para asuntos urgentes fuera  del horario de oficina, no estamos disponibles las 24 horas del d?a, los 7 d?as de la semana.  ? ?Si tiene un problema urgente y no puede comunicarse con nosotros, puede optar por buscar atenci?n m?dica  en el consultorio de su doctor(a), en una cl?nica privada, en un centro de atenci?n urgente o en una sala de emergencias. ? ?Si tiene Engineer, maintenance (IT) m?dica, por favor llame inmediatamente al 911 o vaya a la sala de emergencias. ? ?N?meros de b?per ? ?- Dr. Nehemiah Massed: [phone] ? ?- Dra. Moye: [phone] ? ?- Dra. Nicole Kindred: [phone] ? ?En caso de inclemencias del tiempo, por favor llame a nuestra l?nea principal al [phone] para una actualizaci?n sobre el estado de cualquier retraso o cierre. ? ?Consejos para la medicaci?n en dermatolog?a: ?Por favor, guarde las cajas en las que vienen los medicamentos de uso t?pico para ayudarle a seguir las instrucciones sobre d?nde y c?mo usarlos. Las farmacias generalmente imprimen las instrucciones del medicamento s?lo en las cajas y no  directamente en los tubos del medicamento.  ? ?Si su medicamento es muy caro, por favor, p?ngase en contacto con Zigmund Daniel llamando al [phone] y presione la opci?n 4 o env?enos un mensaje a trav?s de MyChart.  ? ?No podemos decirle cu?l ser? su copago por los medicamentos por adelantado ya que esto es diferente dependiendo de la cobertura de su seguro. Sin embargo, es posible que podamos encontrar un medicamento sustituto a Electrical engineer un formulario para que el seguro cubra el medicamento que se considera necesario.  ? ?Si se requiere Ardelia Mems autorizaci?n previa para que su compa??a de seguros [place] su medicamento, por favor perm?tanos de 1 a 2 d?as h?biles para completar este proceso. ? ?Los precios de los medicamentos var?an con frecuencia dependiendo del Environmental consultant de d?nde se surte la receta y alguna farmacias pueden ofrecer precios m?s baratos. ? ?El sitio web www.goodrx.com tiene cupones para medicamentos de Office manager. Los precios aqu? no tienen en cuenta lo que podr?a costar con la ayuda del seguro (puede ser m?s barato con su seguro), pero el sitio web puede darle el precio si no utiliz? ning?n seguro.  ?- P

## 2021-09-18 ENCOUNTER — Encounter: Payer: Self-pay | Admitting: Dermatology

## 2021-10-30 DIAGNOSIS — M5416 Radiculopathy, lumbar region: Secondary | ICD-10-CM | POA: Insufficient documentation

## 2021-10-30 DIAGNOSIS — M47816 Spondylosis without myelopathy or radiculopathy, lumbar region: Secondary | ICD-10-CM | POA: Insufficient documentation

## 2022-03-04 ENCOUNTER — Ambulatory Visit: Payer: Medicare Other | Admitting: Dermatology

## 2022-03-11 ENCOUNTER — Ambulatory Visit: Payer: Medicare Other | Admitting: Dermatology

## 2022-05-06 ENCOUNTER — Ambulatory Visit: Payer: Medicare Other | Admitting: Dermatology

## 2022-05-17 ENCOUNTER — Other Ambulatory Visit: Payer: Medicare Other

## 2022-05-23 ENCOUNTER — Ambulatory Visit: Payer: Medicare Other | Admitting: Urology

## 2022-05-24 ENCOUNTER — Encounter: Payer: Self-pay | Admitting: Urology

## 2022-05-29 ENCOUNTER — Ambulatory Visit: Payer: Medicare Other | Admitting: Dermatology

## 2022-07-02 ENCOUNTER — Ambulatory Visit: Payer: Medicare Other | Admitting: Dermatology

## 2022-07-16 ENCOUNTER — Ambulatory Visit: Payer: Medicare Other | Admitting: Dermatology

## 2022-07-16 VITALS — BP 142/77 | HR 68

## 2022-07-16 DIAGNOSIS — B356 Tinea cruris: Secondary | ICD-10-CM | POA: Diagnosis not present

## 2022-07-16 DIAGNOSIS — L821 Other seborrheic keratosis: Secondary | ICD-10-CM

## 2022-07-16 DIAGNOSIS — Z1283 Encounter for screening for malignant neoplasm of skin: Secondary | ICD-10-CM | POA: Diagnosis not present

## 2022-07-16 DIAGNOSIS — L081 Erythrasma: Secondary | ICD-10-CM

## 2022-07-16 DIAGNOSIS — L578 Other skin changes due to chronic exposure to nonionizing radiation: Secondary | ICD-10-CM | POA: Diagnosis not present

## 2022-07-16 DIAGNOSIS — C44619 Basal cell carcinoma of skin of left upper limb, including shoulder: Secondary | ICD-10-CM | POA: Diagnosis not present

## 2022-07-16 DIAGNOSIS — C4491 Basal cell carcinoma of skin, unspecified: Secondary | ICD-10-CM

## 2022-07-16 DIAGNOSIS — L570 Actinic keratosis: Secondary | ICD-10-CM | POA: Diagnosis not present

## 2022-07-16 DIAGNOSIS — Z79899 Other long term (current) drug therapy: Secondary | ICD-10-CM

## 2022-07-16 DIAGNOSIS — D229 Melanocytic nevi, unspecified: Secondary | ICD-10-CM

## 2022-07-16 DIAGNOSIS — Z7189 Other specified counseling: Secondary | ICD-10-CM

## 2022-07-16 DIAGNOSIS — D492 Neoplasm of unspecified behavior of bone, soft tissue, and skin: Secondary | ICD-10-CM

## 2022-07-16 DIAGNOSIS — L814 Other melanin hyperpigmentation: Secondary | ICD-10-CM

## 2022-07-16 HISTORY — DX: Basal cell carcinoma of skin, unspecified: C44.91

## 2022-07-16 MED ORDER — TERBINAFINE HCL 250 MG PO TABS: 250.0000 mg | ORAL_TABLET | Freq: Every day | ORAL | 0 refills | Status: AC

## 2022-07-16 MED ORDER — CLINDAMYCIN PHOSPHATE 1 % EX LOTN: TOPICAL_LOTION | Freq: Every day | CUTANEOUS | 2 refills | Status: AC

## 2022-07-16 NOTE — Progress Notes (Signed)
Follow-Up Visit   Subjective  Sean Hines is a 89 y.o. male who presents for the following: Actinic Keratosis (Face, scalp, ears, >40mf/u) and Upper body skin exam (Check bump, groin, 667mhx of Tinea cruris resolved per pt). The patient presents for Upper Body Skin Exam (UBSE) for skin cancer screening and mole check.  The patient has spots, moles and lesions to be evaluated, some may be new or changing and the patient has concerns that these could be cancer.   The following portions of the chart were reviewed this encounter and updated as appropriate:   Tobacco  Allergies  Meds  Problems  Med Hx  Surg Hx  Fam Hx     Review of Systems:  No other skin or systemic complaints except as noted in HPI or Assessment and Plan.  Objective  Well appearing patient in no apparent distress; mood and affect are within normal limits.  A focused examination was performed including face, scalp, ears. Relevant physical exam findings are noted in the Assessment and Plan.  L ant deltoid Hyperkeratotic pap 1.2cm     groin Scaly patch groin with brown discoloration  face x 5 (5) Pink scaly macules   Assessment & Plan   Lentigines - Scattered tan macules - Due to sun exposure - Benign-appearing, observe - Recommend daily broad spectrum sunscreen SPF 30+ to sun-exposed areas, reapply every 2 hours as needed. - Call for any changes - upper back  Seborrheic Keratoses - Stuck-on, waxy, tan-brown papules and/or plaques  - Benign-appearing - Discussed benign etiology and prognosis. - Observe - Call for any changes - trunk,arms, groin  Melanocytic Nevi - Tan-brown and/or pink-flesh-colored symmetric macules and papules - Benign appearing on exam today - Observation - Call clinic for new or changing moles - Recommend daily use of broad spectrum spf 30+ sunscreen to sun-exposed areas.  - trunk  Hemangiomas - Red papules - Discussed benign nature - Observe - Call for any  changes - trunk  Actinic Damage - Chronic condition, secondary to cumulative UV/sun exposure - diffuse scaly erythematous macules with underlying dyspigmentation - Recommend daily broad spectrum sunscreen SPF 30+ to sun-exposed areas, reapply every 2 hours as needed.  - Staying in the shade or wearing long sleeves, sun glasses (UVA+UVB protection) and wide brim hats (4-inch brim around the entire circumference of the hat) are also recommended for sun protection.  - Call for new or changing lesions.  Skin cancer screening performed today.   Neoplasm of skin L ant deltoid Epidermal / dermal shaving  Lesion diameter (cm):  1.2 Informed consent: discussed and consent obtained   Timeout: patient name, date of birth, surgical site, and procedure verified   Procedure prep:  Patient was prepped and draped in usual sterile fashion Prep type:  Isopropyl alcohol Anesthesia: the lesion was anesthetized in a standard fashion   Anesthetic:  1% lidocaine w/ epinephrine 1-100,000 buffered w/ 8.4% NaHCO3 Instrument used: flexible razor blade   Hemostasis achieved with: pressure, aluminum chloride and electrodesiccation   Outcome: patient tolerated procedure well   Post-procedure details: sterile dressing applied and wound care instructions given   Dressing type: bandage and bacitracin    Destruction of lesion Complexity: extensive   Destruction method: electrodesiccation and curettage   Informed consent: discussed and consent obtained   Timeout:  patient name, date of birth, surgical site, and procedure verified Procedure prep:  Patient was prepped and draped in usual sterile fashion Prep type:  Isopropyl alcohol Anesthesia: the  lesion was anesthetized in a standard fashion   Anesthetic:  1% lidocaine w/ epinephrine 1-100,000 buffered w/ 8.4% NaHCO3 Curettage performed in three different directions: Yes   Electrodesiccation performed over the curetted area: Yes   Lesion length (cm):   1.2 Lesion width (cm):  1.2 Margin per side (cm):  0.2 Final wound size (cm):  1.6 Hemostasis achieved with:  pressure, aluminum chloride and electrodesiccation Outcome: patient tolerated procedure well with no complications   Post-procedure details: sterile dressing applied and wound care instructions given   Dressing type: bandage and bacitracin    Specimen 1 - Surgical pathology Differential Diagnosis: D48.5 R/O SCC  Check Margins: No Hyperkeratotic pap 1.2cm EDC today  Tinea cruris groin With Erythrasma  Labs /liver tests from 03/23 normal Chronic and persistent condition with duration or expected duration over one year. Condition is symptomatic / bothersome to patient. Not to goal.  Start Lamisil '250mg'$  1 po qd x 82mStart Clindamycin lotion qd to groin  Terbinafine Counseling Terbinafine is an anti-fungal medicine that can be applied to the skin (over the counter) or taken by mouth (prescription) to treat fungal infections. The pill version is often used to treat fungal infections of the nails or scalp. While most people do not have any side effects from taking terbinafine pills, some possible side effects of the medicine can include taste changes, headache, loss of smell, vision changes, nausea, vomiting, or diarrhea.   Rare side effects can include irritation of the liver, allergic reaction, or decrease in blood counts (which may show up as not feeling well or developing an infection). If you are concerned about any of these side effects, please stop the medicine and call your doctor, or in the case of an emergency such as feeling very unwell, seek immediate medical care.    terbinafine (LAMISIL) 250 MG tablet - groin Take 1 tablet (250 mg total) by mouth daily.  clindamycin (CLEOCIN T) 1 % lotion - groin Apply topically daily. Qd to rash in groin until clear, then prn flares Related Medications ketoconazole (NIZORAL) 2 % cream Apply 1 application. topically daily. Apply to  groin for rash  AK (actinic keratosis) (5) face x 5 Destruction of lesion - face x 5 Complexity: simple   Destruction method: cryotherapy   Informed consent: discussed and consent obtained   Timeout:  patient name, date of birth, surgical site, and procedure verified Lesion destroyed using liquid nitrogen: Yes   Region frozen until ice ball extended beyond lesion: Yes   Outcome: patient tolerated procedure well with no complications   Post-procedure details: wound care instructions given    Return in about 2 months (around 09/14/2022) for AK f/u, Tinea Cruris f/u, bx f/u.  I, SOthelia Pulling RMA, am acting as scribe for DSarina Ser MD . Documentation: I have reviewed the above documentation for accuracy and completeness, and I agree with the above.  DSarina Ser MD

## 2022-07-16 NOTE — Patient Instructions (Addendum)
Wound Care Instructions  Cleanse wound gently with soap and water once a day then pat dry with clean gauze. Apply a thin coat of Petrolatum (petroleum jelly, "Vaseline") over the wound (unless you have an allergy to this). We recommend that you use a new, sterile tube of Vaseline. Do not pick or remove scabs. Do not remove the yellow or white "healing tissue" from the base of the wound.  Cover the wound with fresh, clean, nonstick gauze and secure with paper tape. You may use Band-Aids in place of gauze and tape if the wound is small enough, but would recommend trimming much of the tape off as there is often too much. Sometimes Band-Aids can irritate the skin.  You should call the office for your biopsy report after 1 week if you have not already been contacted.  If you experience any problems, such as abnormal amounts of bleeding, swelling, significant bruising, significant pain, or evidence of infection, please call the office immediately.  FOR ADULT SURGERY PATIENTS: If you need something for pain relief you may take 1 extra strength Tylenol (acetaminophen) AND 2 Ibuprofen ('200mg'$  each) together every 4 hours as needed for pain. (do not take these if you are allergic to them or if you have a reason you should not take them.) Typically, you may only need pain medication for 1 to 3 days.     Cryotherapy Aftercare  Wash gently with soap and water everyday.   Apply Vaseline and Band-Aid daily until healed.    For rash in groin Start Terbinafine 1 pill a day for a month Start Clindamycin lotion once daily to rash in groin  Terbinafine Counseling  Terbinafine is an anti-fungal medicine that can be applied to the skin (over the counter) or taken by mouth (prescription) to treat fungal infections. The pill version is often used to treat fungal infections of the nails or scalp. While most people do not have any side effects from taking terbinafine pills, some possible side effects of the medicine  can include taste changes, headache, loss of smell, vision changes, nausea, vomiting, or diarrhea.   Rare side effects can include irritation of the liver, allergic reaction, or decrease in blood counts (which may show up as not feeling well or developing an infection). If you are concerned about any of these side effects, please stop the medicine and call your doctor, or in the case of an emergency such as feeling very unwell, seek immediate medical care.    Due to recent changes in healthcare laws, you may see results of your pathology and/or laboratory studies on MyChart before the doctors have had a chance to review them. We understand that in some cases there may be results that are confusing or concerning to you. Please understand that not all results are received at the same time and often the doctors may need to interpret multiple results in order to provide you with the best plan of care or course of treatment. Therefore, we ask that you please give [place] 2 business days to thoroughly review all your results before contacting the office for clarification. Should we see a critical lab result, you will be contacted sooner.   If You Need Anything After Your Visit  If you have any questions or concerns for your doctor, please call our main line at [phone] and press option 4 to reach your doctor's medical assistant. If no one answers, please leave a voicemail as directed and we will return your call as soon  as possible. Messages left after 4 pm will be answered the following business day.   You may also send [place] a message via [facility]. We typically respond to MyChart messages within 1-2 business days.  For prescription refills, please ask your pharmacy to contact our office. Our fax number is [phone].  If you have an urgent issue when the clinic is closed that cannot wait until the next business day, you can page your doctor at the number below.    Please note that while we do our best to  be available for urgent issues outside of office hours, we are not available 24/7.   If you have an urgent issue and are unable to reach [place], you may choose to seek medical care at your doctor's office, retail clinic, urgent care center, or emergency room.  If you have a medical emergency, please immediately call 911 or go to the emergency department.  Pager Numbers  - Dr. Nehemiah Massed: [phone]  - Dr. Laurence Ferrari: [phone]  - Dr. Nicole Kindred: [phone]  In the event of inclement weather, please call our main line at [phone] for an update on the status of any delays or closures.  Dermatology Medication Tips: Please keep the boxes that topical medications come in in order to help keep track of the instructions about where and how to use these. Pharmacies typically print the medication instructions only on the boxes and not directly on the medication tubes.   If your medication is too expensive, please contact our office at [phone] option 4 or send [place] a message through [place].   We are unable to tell what your co-pay for medications will be in advance as this is different depending on your insurance coverage. However, we may be able to find a substitute medication at lower cost or fill out paperwork to get insurance to cover a needed medication.   If a prior authorization is required to get your medication covered by your insurance company, please allow [place] 1-2 business days to complete this process.  Drug prices often vary depending on where the prescription is filled and some pharmacies may offer cheaper prices.  The website www.goodrx.com contains coupons for medications through different pharmacies. The prices here do not account for what the cost may be with help from insurance (it may be cheaper with your insurance), but the website can give you the price if you did not use any insurance.  - You can print the associated coupon and take it with your prescription to the pharmacy.   - You may also stop by our office during regular business hours and pick up a GoodRx coupon card.  - If you need your prescription sent electronically to a different pharmacy, notify our office through [facility] or by phone at [phone] option 4.     Si Usted Necesita Algo Despus de Su Visita  Tambin puede enviarnos un mensaje a travs de Pharmacist, community. Por lo general respondemos a los mensajes de MyChart en el transcurso de 1 a 2 das hbiles.  Para renovar recetas, por favor pida a su farmacia que se ponga en contacto con nuestra oficina. Harland Dingwall de fax es [place] [phone].  Si tiene un asunto urgente cuando la clnica est cerrada y que no puede esperar hasta el siguiente da hbil, puede llamar/localizar a su doctor(a) al nmero que aparece a continuacin.   Por favor, tenga en cuenta que aunque hacemos todo lo posible para estar disponibles para asuntos urgentes fuera del horario de  oficina, no estamos disponibles las 24 horas del da, los 7 das de la [place].   Si tiene un problema urgente y no puede comunicarse con nosotros, puede optar por buscar atencin mdica  en el consultorio de su doctor(a), en una clnica privada, en un centro de atencin urgente o en una sala de emergencias.  Si tiene Engineering geologist, por favor llame inmediatamente al 911 o vaya a la sala de emergencias.  Nmeros de bper  - Dr. Nehemiah Massed: [phone]  - Dra. Moye: [phone]  - Dra. Nicole Kindred: [phone]  En caso de inclemencias del [place], por favor llame a Johnsie Kindred principal al [phone] para una actualizacin sobre el [place] de cualquier retraso o cierre.  Consejos para la medicacin en dermatologa: Por favor, guarde las cajas en las que vienen los medicamentos de uso tpico para ayudarle a seguir las instrucciones sobre dnde y cmo usarlos. Las farmacias generalmente imprimen las instrucciones del medicamento slo en las cajas y no directamente en los tubos del  [place].   Si su medicamento es muy caro, por favor, pngase en contacto con Zigmund Daniel llamando al [phone] y presione la opcin 4 o envenos un mensaje a travs de Pharmacist, community.   No podemos decirle cul ser su copago por los medicamentos por adelantado ya que esto es diferente dependiendo de la cobertura de su seguro. Sin embargo, es posible que podamos encontrar un medicamento sustituto a Electrical engineer un formulario para que el seguro cubra el medicamento que se considera necesario.   Si se requiere una autorizacin previa para que su compaa de seguros [place] su medicamento, por favor permtanos de 1 a 2 das hbiles para completar este proceso.  Los precios de los medicamentos varan con frecuencia dependiendo del Environmental consultant de dnde se surte la receta y alguna farmacias pueden ofrecer precios ms baratos.  El sitio web www.goodrx.com tiene cupones para medicamentos de Airline pilot. Los precios aqu no tienen en cuenta lo que podra costar con la ayuda del seguro (puede ser ms barato con su seguro), pero el sitio web puede darle el precio si no utiliz Research scientist (physical sciences).  - Puede imprimir el cupn correspondiente y llevarlo con su receta a la farmacia.  - Tambin puede pasar por nuestra oficina durante el horario de atencin regular y Charity fundraiser una tarjeta de cupones de GoodRx.  - Si necesita que su receta se enve electrnicamente a una farmacia diferente, informe a nuestra oficina a travs de MyChart de [place] o por telfono llamando al [phone] y presione la opcin 4.

## 2022-07-22 ENCOUNTER — Telehealth: Payer: Self-pay

## 2022-07-22 NOTE — Telephone Encounter (Signed)
-----   Message from Ralene Bathe, MD sent at 07/18/2022  6:01 PM EST ----- Diagnosis Skin , left ant deltoid BASAL CELL CARCINOMA, NODULAR PATTERN  Cancer - BCC Already treated Recheck next visit

## 2022-07-22 NOTE — Telephone Encounter (Signed)
Called patient to discuss pathology results. Mailbox full. Unable to leave message.

## 2022-07-23 ENCOUNTER — Encounter: Payer: Self-pay | Admitting: Dermatology

## 2022-07-26 ENCOUNTER — Telehealth: Payer: Self-pay

## 2022-07-26 NOTE — Telephone Encounter (Signed)
Patient left voicemail about needing medications from previous office visit. Called patient to get clarification as medications were sent in but no answer and no option for VM. aw

## 2022-07-29 ENCOUNTER — Telehealth: Payer: Self-pay

## 2022-07-29 NOTE — Telephone Encounter (Signed)
Patient advised of BX results.  Also went over medications prescribed at previous office visit. aw

## 2022-07-29 NOTE — Telephone Encounter (Signed)
-----   Message from David C Kowalski, MD sent at 07/18/2022  6:01 PM EST ----- Diagnosis Skin , left ant deltoid BASAL CELL CARCINOMA, NODULAR PATTERN  Cancer - BCC Already treated Recheck next visit 

## 2022-08-20 ENCOUNTER — Telehealth: Payer: Self-pay

## 2022-08-20 NOTE — Telephone Encounter (Signed)
Patient called asking for RF of Triamcinolone Cream. He states he has had a 1 pound jar for a long time that he uses on his "dry skin". Patient states he uses this to the top of his feet and legs.   I do not see this in his history or discussed in a long time.

## 2022-08-20 NOTE — Telephone Encounter (Signed)
Patient left nurse voicemail requesting Triamcinolone RF. No recent history of medication. Called patient to ask him what and where he is using medication for. No answer and VM box is full.

## 2022-08-21 MED ORDER — MOMETASONE FUROATE 0.1 % EX CREA: 1.0000 | TOPICAL_CREAM | CUTANEOUS | 0 refills | Status: DC

## 2022-08-21 NOTE — Telephone Encounter (Signed)
RX sent in. Called pt no answer and VM box full./ aw

## 2022-08-23 NOTE — Telephone Encounter (Signed)
Called patient but no answer and VM box is full

## 2022-08-26 NOTE — Telephone Encounter (Signed)
Made third attempt to contact patient today. aw

## 2022-09-12 ENCOUNTER — Ambulatory Visit: Payer: Medicare Other | Admitting: Dermatology

## 2022-09-18 ENCOUNTER — Ambulatory Visit: Payer: Medicare Other | Admitting: Dermatology

## 2022-10-28 ENCOUNTER — Ambulatory Visit: Payer: Medicare Other | Admitting: Dermatology

## 2022-10-28 ENCOUNTER — Encounter: Payer: Self-pay | Admitting: Dermatology

## 2022-10-28 VITALS — BP 138/78 | HR 68

## 2022-10-28 DIAGNOSIS — L853 Xerosis cutis: Secondary | ICD-10-CM

## 2022-10-28 DIAGNOSIS — W908XXA Exposure to other nonionizing radiation, initial encounter: Secondary | ICD-10-CM

## 2022-10-28 DIAGNOSIS — D692 Other nonthrombocytopenic purpura: Secondary | ICD-10-CM

## 2022-10-28 DIAGNOSIS — L821 Other seborrheic keratosis: Secondary | ICD-10-CM | POA: Diagnosis not present

## 2022-10-28 DIAGNOSIS — X32XXXA Exposure to sunlight, initial encounter: Secondary | ICD-10-CM

## 2022-10-28 DIAGNOSIS — L578 Other skin changes due to chronic exposure to nonionizing radiation: Secondary | ICD-10-CM | POA: Diagnosis not present

## 2022-10-28 NOTE — Progress Notes (Signed)
   Follow Up Visit   Subjective  Sean Hines is a 89 y.o. male who presents for the following: reports he is here concerning bumps at shoulder, chest and dry skin on arms.    The following portions of the chart were reviewed this encounter and updated as appropriate: medications, allergies, medical history  Review of Systems:  No other skin or systemic complaints except as noted in HPI or Assessment and Plan.  Objective  Well appearing patient in no apparent distress; mood and affect are within normal limits.  A focused examination was performed of the following areas: B/l arms, chest, left shoulder   Relevant exam findings are noted in the Assessment and Plan.    Assessment & Plan    Purpura - Chronic; persistent and recurrent.  Treatable, but not curable. - Violaceous macules and patches - Benign - Related to trauma, age, sun damage and/or use of blood thinners, chronic use of topical and/or oral steroids - Observe - Can use OTC arnica containing moisturizer such as Dermend Bruise Formula if desired - Call for worsening or other concerns  Xerosis with pruritus - diffuse xerotic patches - recommend gentle, hydrating skin care, samples given of Dove soap, CeraVe cream and anti-itch lotion- recommend he get the anti-itch cream - gentle skin care handout given -pt has mometasone cream- may use qd/bid to itchy area prn.  SEBORRHEIC KERATOSIS At neck, chest , left shoulder - Stuck-on, waxy, tan-brown papules and/or plaques  - Benign-appearing - Discussed benign etiology and prognosis. - Observe - Call for any changes  ACTINIC DAMAGE - chronic, secondary to cumulative UV radiation exposure/sun exposure over time - diffuse scaly erythematous macules with underlying dyspigmentation - Recommend daily broad spectrum sunscreen SPF 30+ to sun-exposed areas, reapply every 2 hours as needed.  - Recommend staying in the shade or wearing long sleeves, sun glasses (UVA+UVB  protection) and wide brim hats (4-inch brim around the entire circumference of the hat). - Call for new or changing lesions.   Return for keep follow up as scheduled .  I, Asher Muir, CMA, am acting as scribe for Willeen Niece, MD.   Documentation: I have reviewed the above documentation for accuracy and completeness, and I agree with the above.  Willeen Niece, MD

## 2022-10-28 NOTE — Patient Instructions (Addendum)
Do not use mometasone cream as a moisturizer use only to itchy rash as needed.   Recommend over the counter to help with itch      Recommend dove body wash or bar soap  Gentle Skin Care Guide  1. Bathe no more than once a day.  2. Avoid bathing in hot water  3. Use a mild soap like Dove, Vanicream, Cetaphil, CeraVe. Can use Lever 2000 or Cetaphil antibacterial soap  4. Use soap only where you need it. On most days, use it under your arms, between your legs, and on your feet. Let the water rinse other areas unless visibly dirty.  5. When you get out of the bath/shower, use a towel to gently blot your skin dry, don't rub it.  6. While your skin is still a little damp, apply a moisturizing cream such as Vanicream, CeraVe, Cetaphil, Eucerin, Sarna lotion or plain Vaseline Jelly. For hands apply Neutrogena [place] Hand Cream or Excipial Hand Cream.  7. Reapply moisturizer any time you start to itch or feel dry.  8. Sometimes using free and clear laundry detergents can be helpful. Fabric softener sheets should be avoided. Downy Free & Gentle liquid, or any liquid fabric softener that is free of dyes and perfumes, it acceptable to use  9. If your doctor has given you prescription creams you may apply moisturizers over them         Seborrheic Keratosis  What causes seborrheic keratoses? Seborrheic keratoses are harmless, common skin growths that first appear during adult life.  As time goes by, more growths appear.  Some people may develop a large number of them.  Seborrheic keratoses appear on both covered and uncovered body parts.  They are not caused by sunlight.  The tendency to develop seborrheic keratoses can be inherited.  They vary in color from skin-colored to gray, brown, or even black.  They can be either smooth or have a rough, warty surface.   Seborrheic keratoses are superficial and look as if they were stuck on the skin.  Under the microscope this type of keratosis  looks like layers upon layers of skin.  That is why at times the top layer may seem to fall off, but the rest of the growth remains and re-grows.    Treatment Seborrheic keratoses do not need to be treated, but can easily be removed in the office.  Seborrheic keratoses often cause symptoms when they rub on clothing or jewelry.  Lesions can be in the way of shaving.  If they become inflamed, they can cause itching, soreness, or burning.  Removal of a seborrheic keratosis can be accomplished by freezing, burning, or surgery. If any spot bleeds, scabs, or grows rapidly, please return to have it checked, as these can be an indication of a skin cancer.    Due to recent changes in healthcare laws, you may see results of your pathology and/or laboratory studies on MyChart before the doctors have had a chance to review them. We understand that in some cases there may be results that are confusing or concerning to you. Please understand that not all results are received at the same time and often the doctors may need to interpret multiple results in order to provide you with the best plan of care or course of treatment. Therefore, we ask that you please give [place] 2 business days to thoroughly review all your results before contacting the office for clarification. Should we see a critical lab result, you  will be contacted sooner.   If You Need Anything After Your Visit  If you have any questions or concerns for your doctor, please call our main line at [phone] and press option 4 to reach your doctor's medical assistant. If no one answers, please leave a voicemail as directed and we will return your call as soon as possible. Messages left after 4 pm will be answered the following business day.   You may also send [place] a message via MyChart. We typically respond to MyChart messages within 1-2 business days.  For prescription refills, please ask your pharmacy to contact our office. Our fax number is  [phone].  If you have an urgent issue when the clinic is closed that cannot wait until the next business day, you can page your doctor at the number below.    Please note that while we do our best to be available for urgent issues outside of office hours, we are not available 24/7.   If you have an urgent issue and are unable to reach [place], you may choose to seek medical care at your doctor's office, retail clinic, urgent care center, or emergency room.  If you have a medical emergency, please immediately call 911 or go to the emergency department.  Pager Numbers  - Dr. Gwen Pounds: [phone]  - Dr. Neale Burly: [phone]  - Dr. Roseanne Reno: [phone]  In the event of inclement weather, please call our main line at [phone] for an update on the status of any delays or closures.  Dermatology Medication Tips: Please keep the boxes that topical medications come in in order to help keep track of the instructions about where and how to use these. Pharmacies typically print the medication instructions only on the boxes and not directly on the medication tubes.   If your medication is too expensive, please contact our office at [phone] option 4 or send [place] a message through MyChart.   We are unable to tell what your co-pay for medications will be in advance as this is different depending on your insurance coverage. However, we may be able to find a substitute medication at lower cost or fill out paperwork to get insurance to cover a needed medication.   If a prior authorization is required to get your medication covered by your insurance company, please allow [place] 1-2 business days to complete this process.  Drug prices often vary depending on where the prescription is filled and some pharmacies may offer cheaper prices.  The website www.goodrx.com contains coupons for medications through different pharmacies. The prices here do not account for what the cost may be with help from  insurance (it may be cheaper with your insurance), but the website can give you the price if you did not use any insurance.  - You can print the associated coupon and take it with your prescription to the pharmacy.  - You may also stop by our office during regular business hours and pick up a GoodRx coupon card.  - If you need your prescription sent electronically to a different pharmacy, notify our office through [facility] or by phone at [phone] option 4.     Si Usted Necesita Algo Despus de Su Visita  Tambin puede enviarnos un mensaje a travs de Clinical cytogeneticist. Por lo general respondemos a los mensajes de MyChart en el transcurso de 1 a 2 das hbiles.  Para renovar recetas, por favor pida a su farmacia que se ponga en contacto con nuestra oficina. Nuestro nmero de  fax es el [phone].  Si tiene un asunto urgente cuando la clnica est cerrada y que no puede esperar hasta el siguiente da hbil, puede llamar/localizar a su doctor(a) al nmero que aparece a continuacin.   Por favor, tenga en cuenta que aunque hacemos todo lo posible para estar disponibles para asuntos urgentes fuera del horario de [place], no estamos disponibles las 24 horas del da, los 7 [address]  [address] de la [place].   Si tiene un problema urgente y no puede comunicarse con nosotros, puede optar por buscar atencin mdica  en el consultorio de su doctor(a), en una clnica privada, en un centro de atencin urgente o en una sala de emergencias.  Si tiene Engineer, drilling, por favor llame inmediatamente al 911 o vaya a la sala de emergencias.  Nmeros de bper  - Dr. Gwen Pounds: [phone]  - Dra. Moye: [phone]  - Dra. Roseanne Reno: [phone]  En caso de inclemencias del [place], por favor llame a Lacy Duverney principal al [phone] para una actualizacin sobre el [place] de cualquier retraso o cierre.  Consejos para la medicacin en dermatologa: Por favor, guarde las cajas en las que vienen los  medicamentos de uso tpico para ayudarle a seguir las instrucciones sobre dnde y cmo usarlos. Las farmacias generalmente imprimen las instrucciones del medicamento slo en las cajas y no directamente en los tubos del [place].   Si su medicamento es muy caro, por favor, pngase en contacto con Rolm Gala llamando al [phone] y presione la opcin 4 o envenos un mensaje a travs de Clinical cytogeneticist.   No podemos decirle cul ser su copago por los medicamentos por adelantado ya que esto es diferente dependiendo de la cobertura de su seguro. Sin embargo, es posible que podamos encontrar un medicamento sustituto a Audiological scientist un formulario para que el seguro cubra el medicamento que se considera necesario.   Si se requiere una autorizacin previa para que su compaa de seguros [place] su medicamento, por favor permtanos de 1 a 2 das hbiles para completar [address]  Los precios de los medicamentos varan con frecuencia dependiendo del Environmental consultant de dnde se surte la receta y alguna farmacias pueden ofrecer precios ms baratos.  El sitio web www.goodrx.com tiene cupones para medicamentos de Health and safety inspector. Los precios aqu no tienen en cuenta lo que podra costar con la ayuda del seguro (puede ser ms barato con su seguro), pero el sitio web puede darle el precio si no utiliz Tourist information centre manager.  - Puede imprimir el cupn correspondiente y llevarlo con su receta a la farmacia.  - Tambin puede pasar por nuestra oficina durante el horario de atencin regular y Education officer, museum una tarjeta de cupones de GoodRx.  - Si necesita que su receta se enve electrnicamente a una farmacia diferente, informe a nuestra oficina a travs de MyChart de  o por telfono llamando al [phone] y presione la opcin 4.

## 2022-11-06 ENCOUNTER — Ambulatory Visit: Payer: Medicare Other | Admitting: Dermatology

## 2022-12-09 ENCOUNTER — Encounter: Payer: Self-pay | Admitting: Dermatology

## 2022-12-09 ENCOUNTER — Ambulatory Visit: Payer: Medicare Other | Admitting: Dermatology

## 2022-12-09 DIAGNOSIS — Z85828 Personal history of other malignant neoplasm of skin: Secondary | ICD-10-CM

## 2022-12-09 DIAGNOSIS — L081 Erythrasma: Secondary | ICD-10-CM

## 2022-12-09 DIAGNOSIS — L720 Epidermal cyst: Secondary | ICD-10-CM

## 2022-12-09 DIAGNOSIS — L820 Inflamed seborrheic keratosis: Secondary | ICD-10-CM | POA: Diagnosis not present

## 2022-12-09 DIAGNOSIS — D485 Neoplasm of uncertain behavior of skin: Secondary | ICD-10-CM

## 2022-12-09 DIAGNOSIS — L309 Dermatitis, unspecified: Secondary | ICD-10-CM | POA: Diagnosis not present

## 2022-12-09 DIAGNOSIS — W908XXA Exposure to other nonionizing radiation, initial encounter: Secondary | ICD-10-CM

## 2022-12-09 DIAGNOSIS — L570 Actinic keratosis: Secondary | ICD-10-CM

## 2022-12-09 MED ORDER — DUPIXENT 300 MG/2ML ~~LOC~~ SOAJ: 600.0000 mg | Freq: Once | SUBCUTANEOUS | 0 refills | Status: AC

## 2022-12-09 MED ORDER — DUPIXENT 300 MG/2ML ~~LOC~~ SOAJ: 300.0000 mg | SUBCUTANEOUS | 5 refills | Status: AC

## 2022-12-09 MED ORDER — MOMETASONE FUROATE 0.1 % EX CREA: 1.0000 | TOPICAL_CREAM | Freq: Every day | CUTANEOUS | 0 refills | Status: DC | PRN

## 2022-12-09 NOTE — Progress Notes (Addendum)
Follow-Up Visit   Subjective  Sean Hines is a 89 y.o. male who presents for the following: AK follow up. Patient has a spot at left lower neck that is bothersome, present for 3-4 months.  Patient advises the rash at groin has cleared. He was prescribed Lamisil in February which he took for 1 month. Patient also with a cyst at groin that is bothersome and has dry itchy skin.  Patient also here to have bx proven BCC at left ant deltoid rechecked. Was treated with EDC at time of biopsy.  The following portions of the chart were reviewed this encounter and updated as appropriate: medications, allergies, medical history  Review of Systems:  No other skin or systemic complaints except as noted in HPI or Assessment and Plan.  Objective  Well appearing patient in no apparent distress; mood and affect are within normal limits.   A focused examination was performed of the following areas: Groin, chest  Relevant exam findings are noted in the Assessment and Plan.  left upper chest  left upper chest Hyperkeratotic plaque 15 x 13 mm       Assessment & Plan     Eczema, unspecified type  Related Medications mometasone (ELOCON) 0.1 % cream Apply 1 Application topically daily as needed (Rash).  Dupilumab (DUPIXENT) 300 MG/2ML SOPN Inject 600 mg into the skin once for 1 dose. On day 1.  Dupilumab (DUPIXENT) 300 MG/2ML SOPN Inject 300 mg into the skin every 14 (fourteen) days. Starting at day 15 for maintenance.  Inflamed seborrheic keratosis left upper chest  Epidermal / dermal shaving - left upper chest  Lesion diameter (cm):  1.5 Informed consent: discussed and consent obtained   Timeout: patient name, date of birth, surgical site, and procedure verified   Procedure prep:  Patient was prepped and draped in usual sterile fashion Prep type:  Isopropyl alcohol Anesthesia: the lesion was anesthetized in a standard fashion   Anesthetic:  1% lidocaine w/ epinephrine  1-100,000 buffered w/ 8.4% NaHCO3 Instrument used: DermaBlade   Hemostasis achieved with: pressure and aluminum chloride   Outcome: patient tolerated procedure well   Post-procedure details: sterile dressing applied and wound care instructions given   Dressing type: bandage and petrolatum   Additional details:  Given symptoms, patient opts to have shave removal of inflamed seborrheic keratosis. Cryosurgery would be insufficiently effective for a lesion this thick.  Specimen 1 - Surgical pathology Differential Diagnosis: Irritated Seborrheic Keratosis   Check Margins: No Hyperkeratotic plaque 15 x 13 mm  EPIDERMAL INCLUSION CYST Exam: Subcutaneous nodule at right groin  Benign-appearing. Exam most consistent with an epidermal inclusion cyst. Discussed that a cyst is a benign growth that can grow over time and sometimes get irritated or inflamed. Recommend observation if it is not bothersome. Discussed option of surgical excision to remove it if it is growing, symptomatic, or other changes noted. Please call for new or changing lesions so they can be evaluated.  Eczema, chronic problem with expected duration > 1 year, flaring, harming quality of life, and not at patient goal  Exam: Scaly pink papules coalescing to plaques on back and upper extremities, 5-10% BSA, moderate severity  Treatment Plan: Start mometasone twice daily to affected areas as needed for itch. Avoid applying to face, groin, and axilla. Use as directed. Long-term use can cause thinning of the skin.  Topical steroids (such as triamcinolone, fluocinolone, fluocinonide, mometasone, clobetasol, halobetasol, betamethasone, hydrocortisone) can cause thinning and lightening of the skin if they are  used for too long in the same area. Your physician has selected the right strength medicine for your problem and area affected on the body. Please use your medication only as directed by your physician to prevent side effects.    Recommend starting Dupixent 300 mg/50mL SQ QOW. Dupilumab (Dupixent) is a treatment given by injection for adults and children with moderate-to-severe atopic dermatitis. Goal is control of skin condition, not cure. It is given as 2 injections at the first dose followed by 1 injection every 2 weeks thereafter.  Young children are dosed monthly.  Potential side effects include allergic reaction, susceptibility to parasitic infection (patient drinks city water), injection site reactions and conjunctivitis (inflammation of the eyes).  The use of Dupixent requires long term medication management, including periodic office visits.  Will submit for Dupixent. Patient will continue topicals until we get approval.   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  - Call if any new or changing lesions are noted between office visits  Tinea cruris with Erythrasma (resolved) Labs /liver tests from 03/23 normal 07/16/22: treatment initiated with Lamisil 250mg  1 po qd x 36m and Clindamycin lotion qd to groin - patient does not remember taking Lamisil. Patient did use clindamycin lotion and the rash resolved.  ACTINIC KERATOSIS Exam: Erythematous thin papules/macules with gritty scale at the right temple, right temporal scalp, right preauricular cheek  Actinic keratoses are precancerous spots that appear secondary to cumulative UV radiation exposure/sun exposure over time. They are chronic with expected duration over 1 year. A portion of actinic keratoses will progress to squamous cell carcinoma of the skin. It is not possible to reliably predict which spots will progress to skin cancer and so treatment is recommended to prevent development of skin cancer.  Offered cryotherapy for lesions today. Patient opts  to wait until next appointment for treatment  Return for as scheduled.  Anise Salvo, RMA, am acting as scribe for [name], MD .   Documentation: I have reviewed the above documentation for accuracy and completeness, and I agree with the above.  [name], MD

## 2022-12-09 NOTE — Patient Instructions (Addendum)
Wound Care Instructions  Cleanse wound gently with soap and water once a day then pat dry with clean gauze. Apply a thin coat of Petrolatum (petroleum jelly, "Vaseline") over the wound (unless you have an allergy to this). We recommend that you use a new, sterile tube of Vaseline. Do not pick or remove scabs. Do not remove the yellow or white "healing tissue" from the base of the wound.  Cover the wound with fresh, clean, nonstick gauze and secure with paper tape. You may use Band-Aids in place of gauze and tape if the wound is small enough, but would recommend trimming much of the tape off as there is often too much. Sometimes Band-Aids can irritate the skin.  You should call the office for your biopsy report after 1 week if you have not already been contacted.  If you experience any problems, such as abnormal amounts of bleeding, swelling, significant bruising, significant pain, or evidence of infection, please call the office immediately.  FOR ADULT SURGERY PATIENTS: If you need something for pain relief you may take 1 extra strength Tylenol (acetaminophen) AND 2 Ibuprofen (200mg  each) together every 4 hours as needed for pain. (do not take these if you are allergic to them or if you have a reason you should not take them.) Typically, you may only need pain medication for 1 to 3 days.   Start mometasone twice daily to affected areas as needed for itch. Avoid applying to face, groin, and axilla. Use as directed. Long-term use can cause thinning of the skin.  Topical steroids (such as triamcinolone, fluocinolone, fluocinonide, mometasone, clobetasol, halobetasol, betamethasone, hydrocortisone) can cause thinning and lightening of the skin if they are used for too long in the same area. Your physician has selected the right strength medicine for your problem and area affected on the body. Please use your medication only as directed by your physician to prevent side effects.   Due to recent changes in  healthcare laws, you may see results of your pathology and/or laboratory studies on MyChart before the doctors have had a chance to review them. We understand that in some cases there may be results that are confusing or concerning to you. Please understand that not all results are received at the same time and often the doctors may need to interpret multiple results in order to provide you with the best plan of care or course of treatment. Therefore, we ask that you please give [place] 2 business days to thoroughly review all your results before contacting the office for clarification. Should we see a critical lab result, you will be contacted sooner.   If You Need Anything After Your Visit  If you have any questions or concerns for your doctor, please call our main line at [phone] and press option 4 to reach your doctor's medical assistant. If no one answers, please leave a voicemail as directed and we will return your call as soon as possible. Messages left after 4 pm will be answered the following business day.   You may also send [place] a message via MyChart. We typically respond to MyChart messages within 1-2 business days.  For prescription refills, please ask your pharmacy to contact our office. Our fax number is [phone].  If you have an urgent issue when the clinic is closed that cannot wait until the next business day, you can page your doctor at the number below.    Please note that while we do our best to be available for  urgent issues outside of office hours, we are not available 24/7.   If you have an urgent issue and are unable to reach [place], you may choose to seek medical care at your doctor's office, retail clinic, urgent care center, or emergency room.  If you have a medical emergency, please immediately call 911 or go to the emergency department.  Pager Numbers  - Dr. Gwen Pounds: [phone]  - Dr. Neale Burly: [phone]  - Dr. Roseanne Reno: [phone]  In the event of inclement  weather, please call our main line at [phone] for an update on the status of any delays or closures.  Dermatology Medication Tips: Please keep the boxes that topical medications come in in order to help keep track of the instructions about where and how to use these. Pharmacies typically print the medication instructions only on the boxes and not directly on the medication tubes.   If your medication is too expensive, please contact our office at [phone] option 4 or send [place] a message through MyChart.   We are unable to tell what your co-pay for medications will be in advance as this is different depending on your insurance coverage. However, we may be able to find a substitute medication at lower cost or fill out paperwork to get insurance to cover a needed medication.   If a prior authorization is required to get your medication covered by your insurance company, please allow [place] 1-2 business days to complete this process.  Drug prices often vary depending on where the prescription is filled and some pharmacies may offer cheaper prices.  The website www.goodrx.com contains coupons for medications through different pharmacies. The prices here do not account for what the cost may be with help from insurance (it may be cheaper with your insurance), but the website can give you the price if you did not use any insurance.  - You can print the associated coupon and take it with your prescription to the pharmacy.  - You may also stop by our office during regular business hours and pick up a GoodRx coupon card.  - If you need your prescription sent electronically to a different pharmacy, notify our office through [facility] or by phone at [phone] option 4.

## 2022-12-25 ENCOUNTER — Telehealth: Payer: Self-pay

## 2022-12-25 NOTE — Telephone Encounter (Signed)
Spoke with patient multiple times about Dupixent My Way forms. Patient has high copay and enrollment form will  give him access to free drug. Patient questioned where our office if located and does not remember talking to Dr. Katrinka Blazing about this.   Patient will come sign paperwork. aw

## 2022-12-30 ENCOUNTER — Telehealth: Payer: Self-pay

## 2022-12-30 NOTE — Telephone Encounter (Signed)
-----   Message from Lolo sent at 12/17/2022  1:02 PM EDT ----- Diagnosis  Skin , left upper chest SEBORRHEIC KERATOSIS, INFLAMED   Not cancer - ISK As suspected No further treatment needed. Please inform patient. Thank you

## 2022-12-30 NOTE — Telephone Encounter (Signed)
Patient came in to sign paperwork for Dupixent and was advised bx results benign SK. Butch Penny., RMA

## 2023-01-14 ENCOUNTER — Ambulatory Visit: Payer: Medicare Other | Admitting: Dermatology

## 2023-01-30 ENCOUNTER — Encounter: Payer: Self-pay | Admitting: Dermatology

## 2023-01-30 ENCOUNTER — Ambulatory Visit: Payer: Medicare Other | Admitting: Dermatology

## 2023-01-30 DIAGNOSIS — L570 Actinic keratosis: Secondary | ICD-10-CM

## 2023-01-30 DIAGNOSIS — D692 Other nonthrombocytopenic purpura: Secondary | ICD-10-CM

## 2023-01-30 DIAGNOSIS — L820 Inflamed seborrheic keratosis: Secondary | ICD-10-CM

## 2023-01-30 DIAGNOSIS — W908XXA Exposure to other nonionizing radiation, initial encounter: Secondary | ICD-10-CM

## 2023-01-30 DIAGNOSIS — L578 Other skin changes due to chronic exposure to nonionizing radiation: Secondary | ICD-10-CM

## 2023-01-30 NOTE — Progress Notes (Signed)
Follow-Up Visit   Subjective  Sean Hines is a 89 y.o. male who presents for the following: Actinic keratosis.  The patient has spots, moles and lesions to be evaluated, some may be new or changing and the patient may have concern these could be cancer.    The following portions of the chart were reviewed this encounter and updated as appropriate: medications, allergies, medical history  Review of Systems:  No other skin or systemic complaints except as noted in HPI or Assessment and Plan.  Objective  Well appearing patient in no apparent distress; mood and affect are within normal limits.  A focused examination was performed of the following areas: Face, scalp, ears, hands, arms  Relevant exam findings are noted in the Assessment and Plan.  Left Forearm - Posterior x3 (3) Erythematous keratotic or waxy stuck-on papule or plaque.  face x7 (7) Erythematous thin papules/macules with gritty scale.     Assessment & Plan   Inflamed seborrheic keratosis (3) Left Forearm - Posterior x3  Symptomatic, irritating, patient would like treated.  Destruction of lesion - Left Forearm - Posterior x3 (3) Complexity: simple   Destruction method: cryotherapy   Informed consent: discussed and consent obtained   Timeout:  patient name, date of birth, surgical site, and procedure verified Lesion destroyed using liquid nitrogen: Yes   Region frozen until ice ball extended beyond lesion: Yes   Outcome: patient tolerated procedure well with no complications   Post-procedure details: wound care instructions given   Additional details:  Prior to procedure, discussed risks of blister formation, small wound, skin dyspigmentation, or rare scar following cryotherapy. Recommend Vaseline ointment to treated areas while healing.   AK (actinic keratosis) (7) face x7  Actinic keratoses are precancerous spots that appear secondary to cumulative UV radiation exposure/sun exposure over time. They are  chronic with expected duration over 1 year. A portion of actinic keratoses will progress to squamous cell carcinoma of the skin. It is not possible to reliably predict which spots will progress to skin cancer and so treatment is recommended to prevent development of skin cancer.  Recommend daily broad spectrum sunscreen SPF 30+ to sun-exposed areas, reapply every 2 hours as needed.  Recommend staying in the shade or wearing long sleeves, sun glasses (UVA+UVB protection) and wide brim hats (4-inch brim around the entire circumference of the hat). Call for new or changing lesions.  Destruction of lesion - face x7 (7) Complexity: simple   Destruction method: cryotherapy   Informed consent: discussed and consent obtained   Timeout:  patient name, date of birth, surgical site, and procedure verified Lesion destroyed using liquid nitrogen: Yes   Region frozen until ice ball extended beyond lesion: Yes   Outcome: patient tolerated procedure well with no complications   Post-procedure details: wound care instructions given   Additional details:  Prior to procedure, discussed risks of blister formation, small wound, skin dyspigmentation, or rare scar following cryotherapy. Recommend Vaseline ointment to treated areas while healing.   Actinic skin damage  Purpura (HCC)   Purpura - Chronic; persistent and recurrent.  Treatable, but not curable. - Violaceous macules and patches - Benign - Related to trauma, age, sun damage and/or use of blood thinners, chronic use of topical and/or oral steroids - Observe - Can use OTC arnica containing moisturizer such as Dermend Bruise Formula if desired - Call for worsening or other concerns    ACTINIC DAMAGE - chronic, secondary to cumulative UV radiation exposure/sun exposure over time -  diffuse scaly erythematous macules with underlying dyspigmentation - Recommend daily broad spectrum sunscreen SPF 30+ to sun-exposed areas, reapply every 2 hours as  needed.  - Recommend staying in the shade or wearing long sleeves, sun glasses (UVA+UVB protection) and wide brim hats (4-inch brim around the entire circumference of the hat). - Call for new or changing lesions.   Return in about 6 months (around 07/30/2023) for AK Follow Up.  I, Lawson Radar, CMA, am acting as scribe for Armida Sans, MD.   Documentation: I have reviewed the above documentation for accuracy and completeness, and I agree with the above.  Armida Sans, MD

## 2023-01-30 NOTE — Patient Instructions (Addendum)
Cryotherapy Aftercare  Wash gently with soap and water everyday.   Apply Vaseline Jelly daily until healed.   Dermend Bruise Formula if desired for bruising on arms.  Recommend daily broad spectrum sunscreen SPF 30+ to sun-exposed areas, reapply every 2 hours as needed. Call for new or changing lesions.  Staying in the shade or wearing long sleeves, sun glasses (UVA+UVB protection) and wide brim hats (4-inch brim around the entire circumference of the hat) are also recommended for sun protection.    Due to recent changes in healthcare laws, you may see results of your pathology and/or laboratory studies on MyChart before the doctors have had a chance to review them. We understand that in some cases there may be results that are confusing or concerning to you. Please understand that not all results are received at the same time and often the doctors may need to interpret multiple results in order to provide you with the best plan of care or course of treatment. Therefore, we ask that you please give [place] 2 business days to thoroughly review all your results before contacting the office for clarification. Should we see a critical lab result, you will be contacted sooner.   If You Need Anything After Your Visit  If you have any questions or concerns for your doctor, please call our main line at [phone] and press option 4 to reach your doctor's medical assistant. If no one answers, please leave a voicemail as directed and we will return your call as soon as possible. Messages left after 4 pm will be answered the following business day.   You may also send [place] a message via MyChart. We typically respond to MyChart messages within 1-2 business days.  For prescription refills, please ask your pharmacy to contact our office. Our fax number is [phone].  If you have an urgent issue when the clinic is closed that cannot wait until the next business day, you can page your doctor at the number below.     Please note that while we do our best to be available for urgent issues outside of office hours, we are not available 24/7.   If you have an urgent issue and are unable to reach [place], you may choose to seek medical care at your doctor's office, retail clinic, urgent care center, or emergency room.  If you have a medical emergency, please immediately call 911 or go to the emergency department.  Pager Numbers  - Dr. Gwen Pounds: [phone]  - Dr. Roseanne Reno: [phone]  - Dr. Katrinka Blazing: [phone]   In the event of inclement weather, please call our main line at [phone] for an update on the status of any delays or closures.  Dermatology Medication Tips: Please keep the boxes that topical medications come in in order to help keep track of the instructions about where and how to use these. Pharmacies typically print the medication instructions only on the boxes and not directly on the medication tubes.   If your medication is too expensive, please contact our office at [phone] option 4 or send [place] a message through MyChart.   We are unable to tell what your co-pay for medications will be in advance as this is different depending on your insurance coverage. However, we may be able to find a substitute medication at lower cost or fill out paperwork to get insurance to cover a needed medication.   If a prior authorization is required to get your medication covered by your insurance company, please allow  [place] 1-2 business days to complete this process.  Drug prices often vary depending on where the prescription is filled and some pharmacies may offer cheaper prices.  The website www.goodrx.com contains coupons for medications through different pharmacies. The prices here do not account for what the cost may be with help from insurance (it may be cheaper with your insurance), but the website can give you the price if you did not use any insurance.  - You can print the associated coupon and  take it with your prescription to the pharmacy.  - You may also stop by our office during regular business hours and pick up a GoodRx coupon card.  - If you need your prescription sent electronically to a different pharmacy, notify our office through [facility] or by phone at [phone] option 4.     Si Usted Necesita Algo Despus de Su Visita  Tambin puede enviarnos un mensaje a travs de Clinical cytogeneticist. Por lo general respondemos a los mensajes de MyChart en el transcurso de 1 a 2 das hbiles.  Para renovar recetas, por favor pida a su farmacia que se ponga en contacto con nuestra oficina. Annie Sable de fax es [place] [phone].  Si tiene un asunto urgente cuando la clnica est cerrada y que no puede esperar hasta el siguiente da hbil, puede llamar/localizar a su doctor(a) al nmero que aparece a continuacin.   Por favor, tenga en cuenta que aunque hacemos todo lo posible para estar disponibles para asuntos urgentes fuera del horario de [place], no estamos disponibles las 24 horas del da, los 7 [address]  [address] de la [place].   Si tiene un problema urgente y no puede comunicarse con nosotros, puede optar por buscar atencin mdica  en el consultorio de su doctor(a), en una clnica privada, en un centro de atencin urgente o en una sala de emergencias.  Si tiene Engineer, drilling, por favor llame inmediatamente al 911 o vaya a la sala de emergencias.  Nmeros de bper  - Dr. Gwen Pounds: [phone]  - Dra. Roseanne Reno: [phone]  - Dr. Katrinka Blazing: [phone]   En caso de inclemencias del tiempo, por favor llame a Lacy Duverney principal al [phone] para una actualizacin sobre el [place] de cualquier retraso o cierre.  Consejos para la medicacin en dermatologa: Por favor, guarde las cajas en las que vienen los medicamentos de uso tpico para ayudarle a seguir las instrucciones sobre dnde y cmo usarlos. Las farmacias generalmente imprimen las instrucciones del medicamento slo  en las cajas y no directamente en los tubos del [place].   Si su medicamento es muy caro, por favor, pngase en contacto con Rolm Gala llamando al [phone] y presione la opcin 4 o envenos un mensaje a travs de Clinical cytogeneticist.   No podemos decirle cul ser su copago por los medicamentos por adelantado ya que esto es diferente dependiendo de la cobertura de su seguro. Sin embargo, es posible que podamos encontrar un medicamento sustituto a Audiological scientist un formulario para que el seguro cubra el medicamento que se considera necesario.   Si se requiere una autorizacin previa para que su compaa de seguros [place] su medicamento, por favor permtanos de 1 a 2 das hbiles para completar [address]  Los precios de los medicamentos varan con frecuencia dependiendo del Environmental consultant de dnde se surte la receta y alguna farmacias pueden ofrecer precios ms baratos.  El sitio web www.goodrx.com tiene cupones para medicamentos de Health and safety inspector. Los precios aqu no tienen en cuenta lo que  podra costar con la ayuda del seguro (puede ser ms barato con su seguro), pero el sitio web puede darle el precio si no Visual merchandiser.  - Puede imprimir el cupn correspondiente y llevarlo con su receta a la farmacia.  - Tambin puede pasar por nuestra oficina durante el horario de atencin regular y Education officer, museum una tarjeta de cupones de GoodRx.  - Si necesita que su receta se enve electrnicamente a una farmacia diferente, informe a nuestra oficina a travs de MyChart de [place] o por telfono llamando al [phone] y presione la opcin 4.

## 2023-02-13 ENCOUNTER — Ambulatory Visit: Payer: Medicare Other | Admitting: Dermatology

## 2023-04-21 ENCOUNTER — Ambulatory Visit: Payer: Medicare Other | Admitting: Dermatology

## 2023-04-23 ENCOUNTER — Encounter: Payer: Self-pay | Admitting: Dermatology

## 2023-04-23 ENCOUNTER — Ambulatory Visit: Payer: Medicare Other | Admitting: Dermatology

## 2023-04-23 DIAGNOSIS — L209 Atopic dermatitis, unspecified: Secondary | ICD-10-CM | POA: Diagnosis not present

## 2023-04-23 DIAGNOSIS — L821 Other seborrheic keratosis: Secondary | ICD-10-CM

## 2023-04-23 DIAGNOSIS — L309 Dermatitis, unspecified: Secondary | ICD-10-CM | POA: Diagnosis not present

## 2023-04-23 DIAGNOSIS — Z79899 Other long term (current) drug therapy: Secondary | ICD-10-CM

## 2023-04-23 DIAGNOSIS — Z7189 Other specified counseling: Secondary | ICD-10-CM

## 2023-04-23 MED ORDER — DUPILUMAB 300 MG/2ML ~~LOC~~ SOSY
600.0000 mg | PREFILLED_SYRINGE | Freq: Once | SUBCUTANEOUS | Status: AC
  Administered 2023-04-23: 600 mg via SUBCUTANEOUS

## 2023-04-23 MED ORDER — MOMETASONE FUROATE 0.1 % EX CREA: 1.0000 | TOPICAL_CREAM | Freq: Every day | CUTANEOUS | 2 refills | Status: DC | PRN

## 2023-04-23 MED ORDER — DUPILUMAB 300 MG/2ML ~~LOC~~ SOSY: 300.0000 mg | PREFILLED_SYRINGE | SUBCUTANEOUS | Status: AC

## 2023-04-23 NOTE — Progress Notes (Signed)
   Follow-Up Visit   Subjective  Sean Hines is a 89 y.o. male who presents for the following: itching at back. Dupixent was sent in for patient at July office visit. Patient was also given mometasone cream to use twice daily. He does not remember discussing Dupixent and filling out paperwork.   The patient has spots, moles and lesions to be evaluated, some may be new or changing and the patient may have concern these could be cancer.   The following portions of the chart were reviewed this encounter and updated as appropriate: medications, allergies, medical history  Review of Systems:  No other skin or systemic complaints except as noted in HPI or Assessment and Plan.  Objective  Well appearing patient in no apparent distress; mood and affect are within normal limits.   A focused examination was performed of the following areas: back  Relevant exam findings are noted in the Assessment and Plan.    Assessment & Plan   SEBORRHEIC KERATOSIS - Stuck-on, waxy, tan-brown papules and/or plaques  - Benign-appearing - Discussed benign etiology and prognosis. - Observe - Call for any changes   ATOPIC DERMATITIS Exam: Erythematous scaly patches at lower back, forearms, lower legs  Chronic and persistent condition with duration or expected duration over one year. Condition is bothersome/symptomatic for patient. Currently flared.  Atopic dermatitis (eczema) is a chronic, relapsing, pruritic condition that can significantly affect quality of life. It is often associated with allergic rhinitis and/or asthma and can require treatment with topical medications, phototherapy, or in severe cases biologic injectable medication (Dupixent; Adbry) or Oral JAK inhibitors.  Treatment Plan: Start mometasone once daily to affected areas as needed for rash/itch. Avoid applying to face, groin, and axilla. Use as directed. Long-term use can cause thinning of the skin. Start Dupixent  injections. Samples of Dupixent 300 mg/69mL injected to upper right and left arm today. (600 mg/64mL total)  Patient tolerated well. NDC 1308-6578-46 Lot # [id number]  Exp: 12/2024  Recommend gentle skin care.    Eczema, unspecified type  Related Medications Dupilumab (DUPIXENT) 300 MG/2ML SOPN Inject 300 mg into the skin every 14 (fourteen) days. Starting at day 15 for maintenance.  mometasone (ELOCON) 0.1 % cream Apply 1 Application topically daily as needed (Rash). Avoid applying to face, groin, and axilla. Use as directed. Long-term use can cause thinning of the skin.  dupilumab (DUPIXENT) prefilled syringe 600 mg   dupilumab (DUPIXENT) prefilled syringe 300 mg     Return in about 2 weeks (around 05/07/2023) for Dupixent, with nurse and 4 week with Dr. Katrinka Hines.  Sean Hines, RMA, am acting as scribe for Sean Goody, MD .   Documentation: I have reviewed the above documentation for accuracy and completeness, and I agree with the above.  Sean Goody, MD

## 2023-04-23 NOTE — Patient Instructions (Signed)
Treatment Plan: Start mometasone once daily to affected areas as needed for rash/itch. Avoid applying to face, groin, and axilla. Use as directed. Long-term use can cause thinning of the skin.  Recommend gentle skin care.  Gentle Skin Care Guide  1. Bathe no more than once a day.  2. Avoid bathing in hot water  3. Use a mild soap like Dove, Vanicream, Cetaphil, CeraVe. Can use Lever 2000 or Cetaphil antibacterial soap  4. Use soap only where you need it. On most days, use it under your arms, between your legs, and on your feet. Let the water rinse other areas unless visibly dirty.  5. When you get out of the bath/shower, use a towel to gently blot your skin dry, don't rub it.  6. While your skin is still a little damp, apply a moisturizing cream such as Vanicream, CeraVe, Cetaphil, Eucerin, Sarna lotion or plain Vaseline Jelly. For hands apply Neutrogena [place] Hand Cream or Excipial Hand Cream.  7. Reapply moisturizer any time you start to itch or feel dry.  8. Sometimes using free and clear laundry detergents can be helpful. Fabric softener sheets should be avoided. Downy Free & Gentle liquid, or any liquid fabric softener that is free of dyes and perfumes, it acceptable to use  9. If your doctor has given you prescription creams you may apply moisturizers over them    Due to recent changes in healthcare laws, you may see results of your pathology and/or laboratory studies on MyChart before the doctors have had a chance to review them. We understand that in some cases there may be results that are confusing or concerning to you. Please understand that not all results are received at the same time and often the doctors may need to interpret multiple results in order to provide you with the best plan of care or course of treatment. Therefore, we ask that you please give [place] 2 business days to thoroughly review all your results before contacting the office for clarification. Should we see  a critical lab result, you will be contacted sooner.   If You Need Anything After Your Visit  If you have any questions or concerns for your doctor, please call our main line at [phone] and press option 4 to reach your doctor's medical assistant. If no one answers, please leave a voicemail as directed and we will return your call as soon as possible. Messages left after 4 pm will be answered the following business day.   You may also send [place] a message via MyChart. We typically respond to MyChart messages within 1-2 business days.  For prescription refills, please ask your pharmacy to contact our office. Our fax number is [phone].  If you have an urgent issue when the clinic is closed that cannot wait until the next business day, you can page your doctor at the number below.    Please note that while we do our best to be available for urgent issues outside of office hours, we are not available 24/7.   If you have an urgent issue and are unable to reach [place], you may choose to seek medical care at your doctor's office, retail clinic, urgent care center, or emergency room.  If you have a medical emergency, please immediately call 911 or go to the emergency department.  Pager Numbers  - Dr. Gwen Pounds: [phone]  - Dr. Roseanne Reno: [phone]  - Dr. Katrinka Blazing: [phone]   In the event of inclement weather, please call our main line  at [phone] for an update on the status of any delays or closures.  Dermatology Medication Tips: Please keep the boxes that topical medications come in in order to help keep track of the instructions about where and how to use these. Pharmacies typically print the medication instructions only on the boxes and not directly on the medication tubes.   If your medication is too expensive, please contact our office at [phone] option 4 or send [place] a message through MyChart.   We are unable to tell what your co-pay for medications will be in advance as  this is different depending on your insurance coverage. However, we may be able to find a substitute medication at lower cost or fill out paperwork to get insurance to cover a needed medication.   If a prior authorization is required to get your medication covered by your insurance company, please allow [place] 1-2 business days to complete this process.  Drug prices often vary depending on where the prescription is filled and some pharmacies may offer cheaper prices.  The website www.goodrx.com contains coupons for medications through different pharmacies. The prices here do not account for what the cost may be with help from insurance (it may be cheaper with your insurance), but the website can give you the price if you did not use any insurance.  - You can print the associated coupon and take it with your prescription to the pharmacy.  - You may also stop by our office during regular business hours and pick up a GoodRx coupon card.  - If you need your prescription sent electronically to a different pharmacy, notify our office through [facility] or by phone at [phone] option 4.     Si Usted Necesita Algo Despus de Su Visita  Tambin puede enviarnos un mensaje a travs de Clinical cytogeneticist. Por lo general respondemos a los mensajes de MyChart en el transcurso de 1 a 2 das hbiles.  Para renovar recetas, por favor pida a su farmacia que se ponga en contacto con nuestra oficina. Annie Sable de fax es [place] [phone].  Si tiene un asunto urgente cuando la clnica est cerrada y que no puede esperar hasta el siguiente da hbil, puede llamar/localizar a su doctor(a) al nmero que aparece a continuacin.   Por favor, tenga en cuenta que aunque hacemos todo lo posible para estar disponibles para asuntos urgentes fuera del horario de [place], no estamos disponibles las 24 horas del da, los 7 [address]  [address] de la [place].   Si tiene un problema urgente y no puede comunicarse con nosotros, puede optar por  buscar atencin mdica  en el consultorio de su doctor(a), en una clnica privada, en un centro de atencin urgente o en una sala de emergencias.  Si tiene Engineer, drilling, por favor llame inmediatamente al 911 o vaya a la sala de emergencias.  Nmeros de bper  - Dr. Gwen Pounds: [phone]  - Dra. Roseanne Reno: [phone]  - Dr. Katrinka Blazing: [phone]   En caso de inclemencias del tiempo, por favor llame a Lacy Duverney principal al [phone] para una actualizacin sobre el [place] de cualquier retraso o cierre.  Consejos para la medicacin en dermatologa: Por favor, guarde las cajas en las que vienen los medicamentos de uso tpico para ayudarle a seguir las instrucciones sobre dnde y cmo usarlos. Las farmacias generalmente imprimen las instrucciones del medicamento slo en las cajas y no directamente en los tubos del [place].   Si su medicamento es [employer], por favor, pngase en  contacto con Rolm Gala llamando al [phone] y presione la opcin 4 o envenos un mensaje a travs de Clinical cytogeneticist.   No podemos decirle cul ser su copago por los medicamentos por adelantado ya que esto es diferente dependiendo de la cobertura de su seguro. Sin embargo, es posible que podamos encontrar un medicamento sustituto a Audiological scientist un formulario para que el seguro cubra el medicamento que se considera necesario.   Si se requiere una autorizacin previa para que su compaa de seguros [place] su medicamento, por favor permtanos de 1 a 2 das hbiles para completar [address]  Los precios de los medicamentos varan con frecuencia dependiendo del Environmental consultant de dnde se surte la receta y alguna farmacias pueden ofrecer precios ms baratos.  El sitio web www.goodrx.com tiene cupones para medicamentos de Health and safety inspector. Los precios aqu no tienen en cuenta lo que podra costar con la ayuda del seguro (puede ser ms barato con su seguro), pero el sitio web puede darle el precio si no  utiliz Tourist information centre manager.  - Puede imprimir el cupn correspondiente y llevarlo con su receta a la farmacia.  - Tambin puede pasar por nuestra oficina durante el horario de atencin regular y Education officer, museum una tarjeta de cupones de GoodRx.  - Si necesita que su receta se enve electrnicamente a una farmacia diferente, informe a nuestra oficina a travs de MyChart de [place] o por telfono llamando al [phone] y presione la opcin 4.

## 2023-05-07 ENCOUNTER — Ambulatory Visit: Payer: Medicare Other

## 2023-05-22 ENCOUNTER — Ambulatory Visit: Payer: Medicare Other | Admitting: Dermatology

## 2023-06-16 ENCOUNTER — Ambulatory Visit: Payer: Medicare Other | Admitting: Dermatology

## 2023-06-19 ENCOUNTER — Encounter: Payer: Self-pay | Admitting: Dermatology

## 2023-06-19 ENCOUNTER — Ambulatory Visit: Payer: Medicare Other | Admitting: Dermatology

## 2023-06-19 DIAGNOSIS — L2081 Atopic neurodermatitis: Secondary | ICD-10-CM | POA: Diagnosis not present

## 2023-06-19 DIAGNOSIS — L209 Atopic dermatitis, unspecified: Secondary | ICD-10-CM

## 2023-06-19 DIAGNOSIS — L821 Other seborrheic keratosis: Secondary | ICD-10-CM | POA: Diagnosis not present

## 2023-06-19 DIAGNOSIS — L309 Dermatitis, unspecified: Secondary | ICD-10-CM

## 2023-06-19 MED ORDER — MOMETASONE FUROATE 0.1 % EX CREA: 1.0000 | TOPICAL_CREAM | Freq: Every day | CUTANEOUS | 2 refills | Status: AC | PRN

## 2023-06-19 MED ORDER — DUPILUMAB 300 MG/2ML ~~LOC~~ SOSY
300.0000 mg | PREFILLED_SYRINGE | SUBCUTANEOUS | Status: AC
  Administered 2023-06-19 – 2023-07-03 (×2): 300 mg via SUBCUTANEOUS

## 2023-06-19 NOTE — Patient Instructions (Signed)

## 2023-06-19 NOTE — Progress Notes (Signed)
Follow-Up Visit   Subjective  Sean Hines is a 89 y.o. male who presents for the following: check spots, L post shoulder, L chest, R hand 49m, itchy, Atopic derm trunk, extremities, itching, pt had Dupixent loading dose 04/23/23 and did not return for 2 wk f/u Dupixent, currently using topicals The patient has spots, moles and lesions to be evaluated, some may be new or changing and the patient may have concern these could be cancer.   The following portions of the chart were reviewed this encounter and updated as appropriate: medications, allergies, medical history  Review of Systems:  No other skin or systemic complaints except as noted in HPI or Assessment and Plan.  Objective  Well appearing patient in no apparent distress; mood and affect are within normal limits.   A focused examination was performed of the following areas: Chest, back  Relevant exam findings are noted in the Assessment and Plan.    Assessment & Plan   SEBORRHEIC KERATOSIS R dorsum hand, L post shoudler - Stuck-on, waxy, tan-brown papules and/or plaques  - Benign-appearing - Discussed benign etiology and prognosis. - Observe - Call for any changes   ATOPIC DERMATITIS Trunk, extremities Exam: Scaly pink papules coalescing to plaques 20% BSA  Chronic and persistent condition with duration or expected duration over one year. Condition is symptomatic/ bothersome to patient. Not currently at goal.   Atopic dermatitis - Severe, on Dupixent (biologic medication).  Atopic dermatitis (eczema) is a chronic, relapsing, pruritic condition that can significantly affect quality of life. It is often associated with allergic rhinitis and/or asthma and can require treatment with topical medications, phototherapy, or in severe cases biologic medications, which require long term medication management.    Treatment Plan: Restart Dupixent 300mg /69ml sq injections q 2 wks Dupixent 300mg /14ml sq injection today to R  upper arm Lot [id number] exp 08/2025 Cont Mometasone cr qd to aa eczema prn flares, avoid f/g/a  Dupilumab (Dupixent) is a treatment given by injection for adults and children with moderate-to-severe atopic dermatitis. Goal is control of skin condition, not cure. It is given as 2 injections at the first dose followed by 1 injection ever 2 weeks thereafter.  Young children are dosed monthly.  Potential side effects include allergic reaction, herpes infections, injection site reactions and conjunctivitis (inflammation of the eyes).  The use of Dupixent requires long term medication management, including periodic office visits.  Topical steroids (such as triamcinolone, fluocinolone, fluocinonide, mometasone, clobetasol, halobetasol, betamethasone, hydrocortisone) can cause thinning and lightening of the skin if they are used for too long in the same area. Your physician has selected the right strength medicine for your problem and area affected on the body. Please use your medication only as directed by your physician to prevent side effects.    Recommend gentle skin care.     ATOPIC NEURODERMATITIS   Related Medications dupilumab (DUPIXENT) prefilled syringe 300 mg  ECZEMA, UNSPECIFIED TYPE   Related Medications Dupilumab (DUPIXENT) 300 MG/2ML SOPN Inject 300 mg into the skin every 14 (fourteen) days. Starting at day 15 for maintenance. mometasone (ELOCON) 0.1 % cream Apply 1 Application topically daily as needed (Rash). Avoid applying to face, groin, and axilla. Use as directed. Long-term use can cause thinning of the skin.  Return in about 2 weeks (around 07/03/2023) for with nurse dupixent injection, 4 wks Dr. Katrinka Blazing AD.  I, Ardis Rowan, RMA, am acting as scribe for [name], MD .   Documentation: I have reviewed the above documentation for  accuracy and completeness, and I agree with the above.  [name], MD

## 2023-07-03 ENCOUNTER — Ambulatory Visit (INDEPENDENT_AMBULATORY_CARE_PROVIDER_SITE_OTHER): Payer: Medicare Other

## 2023-07-03 DIAGNOSIS — L209 Atopic dermatitis, unspecified: Secondary | ICD-10-CM

## 2023-07-03 NOTE — Progress Notes (Signed)
Patient here for two week Dupixent injection or Severe Atopic Dermatitis.    Dupixent 300mg  Pen injected into left Upper Arm. Patient tolerated procedure well.    Lot: [id number] Exp: 12/18/2024 [id number]   Dorathy Daft, RMA

## 2023-07-17 ENCOUNTER — Encounter: Payer: Self-pay | Admitting: Dermatology

## 2023-07-17 ENCOUNTER — Ambulatory Visit: Payer: Medicare Other | Admitting: Dermatology

## 2023-07-17 DIAGNOSIS — Z7189 Other specified counseling: Secondary | ICD-10-CM

## 2023-07-17 DIAGNOSIS — L209 Atopic dermatitis, unspecified: Secondary | ICD-10-CM | POA: Diagnosis not present

## 2023-07-17 DIAGNOSIS — Z79899 Other long term (current) drug therapy: Secondary | ICD-10-CM | POA: Diagnosis not present

## 2023-07-17 DIAGNOSIS — L821 Other seborrheic keratosis: Secondary | ICD-10-CM | POA: Diagnosis not present

## 2023-07-17 DIAGNOSIS — L2081 Atopic neurodermatitis: Secondary | ICD-10-CM

## 2023-07-17 MED ORDER — DUPIXENT 300 MG/2ML ~~LOC~~ SOSY: 300.0000 mg | PREFILLED_SYRINGE | SUBCUTANEOUS | 3 refills | Status: AC

## 2023-07-17 MED ORDER — DUPILUMAB 300 MG/2ML ~~LOC~~ SOSY
300.0000 mg | PREFILLED_SYRINGE | SUBCUTANEOUS | Status: AC
  Administered 2023-07-17: 300 mg via SUBCUTANEOUS

## 2023-07-17 NOTE — Progress Notes (Signed)
   Follow-Up Visit   Subjective  Sean Hines is a 89 y.o. male who presents for the following: Atopic dermatitis, trunk, extremities, 51m f/u after restarting Dupixent sq injections q 2 wks, mometasone cr qd to bid itching not improved, itching mostly lower back, upper legs The patient has spots, moles and lesions to be evaluated, some may be new or changing and the patient may have concern these could be cancer.   The following portions of the chart were reviewed this encounter and updated as appropriate: medications, allergies, medical history  Review of Systems:  No other skin or systemic complaints except as noted in HPI or Assessment and Plan.  Objective  Well appearing patient in no apparent distress; mood and affect are within normal limits.   A focused examination was performed of the following areas: Back, chest, arms  Relevant exam findings are noted in the Assessment and Plan.    Assessment & Plan   ATOPIC DERMATITIS, flaring despite Dupixent and mometasone Trunk, extremities Exam: Scaly pink papules coalescing to plaques 20% BSA  Chronic and persistent condition with duration or expected duration over one year. Condition is bothersome/symptomatic for patient. Currently flared.   Atopic dermatitis - Severe, on Dupixent (biologic medication).  Atopic dermatitis (eczema) is a chronic, relapsing, pruritic condition that can significantly affect quality of life. It is often associated with allergic rhinitis and/or asthma and can require treatment with topical medications, phototherapy, or in severe cases biologic medications, which require long term medication management.    Treatment Plan: Cont Dupixent 300mg /22ml sq injections q 2 wks, will plan 2 more months of treatment if still not improving will d/c Dupixent 300mg /26ml sq injection to R upper arm today, sample x 1 Lot [id number], exp 08/2025 Cont Mometasone cr qd/bid aa itching prn flares  Recommend gentle skin care.    Dupilumab (Dupixent) is a treatment given by injection for adults and children with moderate-to-severe atopic dermatitis. Goal is control of skin condition, not cure. It is given as 2 injections at the first dose followed by 1 injection ever 2 weeks thereafter.  Young children are dosed monthly.  Potential side effects include allergic reaction, herpes infections, injection site reactions and conjunctivitis (inflammation of the eyes).  The use of Dupixent requires long term medication management, including periodic office visits.  Topical steroids (such as triamcinolone, fluocinolone, fluocinonide, mometasone, clobetasol, halobetasol, betamethasone, hydrocortisone) can cause thinning and lightening of the skin if they are used for too long in the same area. Your physician has selected the right strength medicine for your problem and area affected on the body. Please use your medication only as directed by your physician to prevent side effects.      SEBORRHEIC KERATOSIS R dorsum hand, trunk - Stuck-on, waxy, tan-brown papules and/or plaques  - Benign-appearing - Discussed benign etiology and prognosis. - Observe - Call for any changes  ATOPIC NEURODERMATITIS   Related Medications dupilumab (DUPIXENT) prefilled syringe 300 mg  LONG-TERM USE OF HIGH-RISK MEDICATION   COUNSELING AND COORDINATION OF CARE   SEBORRHEIC KERATOSES    Return in about 2 weeks (around 07/31/2023) for nurse, 71m Dr. Katrinka Blazing AD f/u.  I, Ardis Rowan, RMA, am acting as scribe for [name], MD .   Documentation: I have reviewed the above documentation for accuracy and completeness, and I agree with the above.  [name], MD

## 2023-07-17 NOTE — Patient Instructions (Signed)

## 2023-07-31 ENCOUNTER — Ambulatory Visit: Payer: Medicare Other

## 2023-08-05 ENCOUNTER — Ambulatory Visit (INDEPENDENT_AMBULATORY_CARE_PROVIDER_SITE_OTHER)

## 2023-08-05 DIAGNOSIS — L209 Atopic dermatitis, unspecified: Secondary | ICD-10-CM | POA: Diagnosis not present

## 2023-08-05 DIAGNOSIS — L2081 Atopic neurodermatitis: Secondary | ICD-10-CM

## 2023-08-05 MED ORDER — DUPILUMAB 300 MG/2ML ~~LOC~~ SOAJ
300.0000 mg | Freq: Once | SUBCUTANEOUS | Status: AC
  Administered 2023-08-05: 300 mg via SUBCUTANEOUS

## 2023-08-05 NOTE — Progress Notes (Signed)
 Patient here for two week Dupixent injection or Severe Atopic Dermatitis.    Dupixent 300mg  Pen injected into left Upper Arm. Patient tolerated procedure well.  No Dupixent syringes available for today.    Lot: [id number]  EXP: 01/17/2025 NDC:0024-5915-20   Dorathy Daft, RMA

## 2023-08-06 ENCOUNTER — Ambulatory Visit: Payer: Medicare Other | Admitting: Dermatology

## 2023-08-19 ENCOUNTER — Ambulatory Visit: Admitting: Dermatology

## 2023-08-21 ENCOUNTER — Ambulatory Visit (INDEPENDENT_AMBULATORY_CARE_PROVIDER_SITE_OTHER)

## 2023-08-21 ENCOUNTER — Telehealth: Payer: Self-pay

## 2023-08-21 DIAGNOSIS — L209 Atopic dermatitis, unspecified: Secondary | ICD-10-CM | POA: Diagnosis not present

## 2023-08-21 DIAGNOSIS — L2081 Atopic neurodermatitis: Secondary | ICD-10-CM

## 2023-08-21 MED ORDER — DUPILUMAB 300 MG/2ML ~~LOC~~ SOAJ
300.0000 mg | Freq: Once | SUBCUTANEOUS | Status: AC
  Administered 2023-08-21: 300 mg via SUBCUTANEOUS

## 2023-08-21 NOTE — Telephone Encounter (Signed)
-----   Message from [place] sent at 08/21/2023  1:50 PM EDT ----- Hi team, can we call this patient's PCP to share that we're concerned about dementia? Patient misses appointments, forgets medical plan, and we're concerned for safety. Thank you ----- Message ----- From: [place] Desanctis, CMA Sent: 08/20/2023   2:13 PM EDT To: [name], MD  Good Afternoon,  This patient called today regarding missing his appointment yesterday. He has completely forgotten that he came in last yesterday and was rescheduling. Just sending to you as a FYI as its concerning to me regarding his memory and taking care of himself.

## 2023-08-21 NOTE — Telephone Encounter (Signed)
 Called patient's PCP. Was on hold >10 minutes. Unable to send message through Epic. Will call again Monday. JP

## 2023-08-21 NOTE — Patient Instructions (Signed)

## 2023-08-21 NOTE — Progress Notes (Signed)
 Patient here for two week Dupixent injection or Severe Atopic Dermatitis.    Dupixent 300mg  Pen injected into right Upper Arm. Patient tolerated procedure well.  No Dupixent syringes available for today.    Lot: [id number]             EXP: 01/17/2025 NDC:0024-5915-20   I, Angelique Holm, CMA, am acting as scribe for [employer] .

## 2023-09-04 ENCOUNTER — Ambulatory Visit

## 2023-09-15 ENCOUNTER — Ambulatory Visit: Payer: Medicare Other | Admitting: Dermatology

## 2023-09-15 ENCOUNTER — Ambulatory Visit: Admitting: Dermatology

## 2023-09-22 ENCOUNTER — Ambulatory Visit: Admitting: Dermatology

## 2023-09-22 NOTE — Telephone Encounter (Signed)
 Message routed to PCP 08/26/2023 regarding cognitive decline and concerns of dementia.

## 2023-10-16 ENCOUNTER — Ambulatory Visit: Admitting: Dermatology

## 2023-10-16 ENCOUNTER — Encounter: Payer: Self-pay | Admitting: Dermatology

## 2023-10-16 DIAGNOSIS — L820 Inflamed seborrheic keratosis: Secondary | ICD-10-CM

## 2023-10-16 DIAGNOSIS — L821 Other seborrheic keratosis: Secondary | ICD-10-CM

## 2023-10-16 DIAGNOSIS — Z79899 Other long term (current) drug therapy: Secondary | ICD-10-CM

## 2023-10-16 DIAGNOSIS — L209 Atopic dermatitis, unspecified: Secondary | ICD-10-CM | POA: Diagnosis not present

## 2023-10-16 DIAGNOSIS — Z7189 Other specified counseling: Secondary | ICD-10-CM

## 2023-10-16 NOTE — Patient Instructions (Addendum)

## 2023-10-16 NOTE — Progress Notes (Signed)
   Follow-Up Visit   Subjective  Sean Hines is a 89 y.o. male who presents for the following: atopic derm follow up. Patient has not had Dupixent  injection since 08/21/23 but advises the itching is ok. Patient does have a spot at right neck that is itchy and bothers him, present for a few months.   The patient has spots, moles and lesions to be evaluated, some may be new or changing and the patient may have concern these could be cancer.   The following portions of the chart were reviewed this encounter and updated as appropriate: medications, allergies, medical history  Review of Systems:  No other skin or systemic complaints except as noted in HPI or Assessment and Plan.  Objective  Well appearing patient in no apparent distress; mood and affect are within normal limits.   A focused examination was performed of the following areas: Neck, back  Relevant exam findings are noted in the Assessment and Plan.  R neck base x 1 Erythematous stuck-on, waxy papule or plaque  Assessment & Plan   SEBORRHEIC KERATOSIS - Stuck-on, waxy, tan-brown papules and/or plaques  - Benign-appearing - Discussed benign etiology and prognosis. - Observe - Call for any changes  ATOPIC DERMATITIS Exam: clear. No xerosis on back 0% BSA  Chronic and persistent condition with duration or expected duration over one year. Condition is improving with treatment but not currently at goal.   Atopic dermatitis (eczema) is a chronic, relapsing, pruritic condition that can significantly affect quality of life. It is often associated with allergic rhinitis and/or asthma and can require treatment with topical medications, phototherapy, or in severe cases biologic injectable medication (Dupixent ; Adbry) or Oral JAK inhibitors.  Treatment Plan: None today Stop dupixent  Recommend gentle skin care. Call if recurrent  INFLAMED SEBORRHEIC KERATOSIS R neck base x 1 Symptomatic, irritating, patient would like  treated.  Benign-appearing.  Call clinic for new or changing lesions.   Destruction of lesion - R neck base x 1 Complexity: simple   Destruction method: cryotherapy   Informed consent: discussed and consent obtained   Timeout:  patient name, date of birth, surgical site, and procedure verified Lesion destroyed using liquid nitrogen: Yes   Region frozen until ice ball extended beyond lesion: Yes   Cryo cycles: 1 or 2. Outcome: patient tolerated procedure well with no complications   Post-procedure details: wound care instructions given   ATOPIC DERMATITIS, UNSPECIFIED TYPE   COUNSELING AND COORDINATION OF CARE    Return if symptoms worsen or fail to improve.  Kerstin Peeling, RMA, am acting as scribe for Harris Liming, MD .   Documentation: I have reviewed the above documentation for accuracy and completeness, and I agree with the above.  Harris Liming, MD

## 2023-12-05 ENCOUNTER — Emergency Department
Admission: EM | Admit: 2023-12-05 | Discharge: 2023-12-05 | Disposition: A | Discharge status: Home / Self Care | Attending: Emergency Medicine | Admitting: Emergency Medicine

## 2023-12-05 ENCOUNTER — Other Ambulatory Visit: Payer: Self-pay

## 2023-12-05 ENCOUNTER — Emergency Department

## 2023-12-05 DIAGNOSIS — I129 Hypertensive chronic kidney disease with stage 1 through stage 4 chronic kidney disease, or unspecified chronic kidney disease: Secondary | ICD-10-CM | POA: Insufficient documentation

## 2023-12-05 DIAGNOSIS — S299XXA Unspecified injury of thorax, initial encounter: Secondary | ICD-10-CM | POA: Diagnosis present

## 2023-12-05 DIAGNOSIS — Y9239 Other specified sports and athletic area as the place of occurrence of the external cause: Secondary | ICD-10-CM | POA: Diagnosis not present

## 2023-12-05 DIAGNOSIS — S2232XA Fracture of one rib, left side, initial encounter for closed fracture: Secondary | ICD-10-CM | POA: Diagnosis not present

## 2023-12-05 DIAGNOSIS — N183 Chronic kidney disease, stage 3 unspecified: Secondary | ICD-10-CM | POA: Insufficient documentation

## 2023-12-05 DIAGNOSIS — W010XXA Fall on same level from slipping, tripping and stumbling without subsequent striking against object, initial encounter: Secondary | ICD-10-CM | POA: Insufficient documentation

## 2023-12-05 MED ORDER — LIDOCAINE 5 % EX PTCH: 1.0000 | MEDICATED_PATCH | Freq: Two times a day (BID) | CUTANEOUS | 1 refills | Status: AC

## 2023-12-05 MED ORDER — LIDOCAINE 5 % EX PTCH
1.0000 | MEDICATED_PATCH | CUTANEOUS | Status: DC
  Administered 2023-12-05: 1 via TRANSDERMAL
  Filled 2023-12-05: qty 1

## 2023-12-05 MED ORDER — ACETAMINOPHEN 500 MG PO TABS
1000.0000 mg | ORAL_TABLET | Freq: Once | ORAL | Status: AC
  Administered 2023-12-05: 1000 mg via ORAL
  Filled 2023-12-05: qty 2

## 2023-12-05 NOTE — Discharge Instructions (Addendum)
 Use Tylenol  for pain and fevers.  Up to 1000 mg per dose, up to 4 times per day.  Do not take more than 4000 mg of Tylenol /acetaminophen  within 24 hours..  Please use lidocaine  patches at your site of pain.  Apply 1 patch at a time, leave on for 12 hours, then remove for 12 hours.  12 hours on, 12 hours off.  Do not apply more than 1 patch at a time.  You may also continue to use your tramadol alongside these other medications  Incentive spirometer breathing device to help take you big breaths and prevent pneumonia.  Try to use this throughout the day, at least hourly

## 2023-12-05 NOTE — ED Triage Notes (Signed)
 Patient states he fell one week ago, now having pain to left ribs.

## 2023-12-05 NOTE — ED Provider Notes (Signed)
 [facility] Provider Note    Event Date/Time   First MD Initiated Contact with Patient 12/05/23 1634     (approximate)   History   Rib Injury   HPI  Sean Hines is a 89 y.o. male who presents to the ED for evaluation of Rib Injury   I review a cardiology clinic visit from April.  HTN, HLD, CKD 3  Patient presents with continued left-sided chest wall pain after a fall that occurred nearly 2 weeks ago.  He is quite active at a local independent living facility, swimming regularly, going to the gym nearly every day.  Reports a mechanical fall at the gym where he tripped on something and landed on his left lateral chest wall.  No head trauma at that time.  No additional syncope.  No fever or cough.   Physical Exam   Triage Vital Signs: ED Triage Vitals  Encounter Vitals Group     BP 12/05/23 1507 (!) 113/59     Girls Systolic BP Percentile --      Girls Diastolic BP Percentile --      Boys Systolic BP Percentile --      Boys Diastolic BP Percentile --      Pulse Rate 12/05/23 1507 67     Resp 12/05/23 1507 20     Temp 12/05/23 1507 98.3 F (36.8 C)     Temp Source 12/05/23 1507 Oral     SpO2 12/05/23 1507 94 %     Weight 12/05/23 1507 140 lb (63.5 kg)     Height 12/05/23 1507 5' 5 (1.651 m)     Head Circumference --      Peak Flow --      Pain Score 12/05/23 1506 8     Pain Loc --      Pain Education --      Exclude from Growth Chart --     Most recent vital signs: Vitals:   12/05/23 1507  BP: (!) 113/59  Pulse: 67  Resp: 20  Temp: 98.3 F (36.8 C)  SpO2: 94%    General: Awake, no distress.  CV:  Good peripheral perfusion.  Resp:  Normal effort.  Abd:  No distention.  Soft and benign abdomen including to the LUQ MSK:  No deformity noted.  Neuro:  No focal deficits appreciated. Other:  Tenderness to anterior and lateral left-sided chest wall around T7/8.  No rash or skin change   ED Results / Procedures / Treatments    Labs (all labs ordered are listed, but only abnormal results are displayed) Labs Reviewed - No data to display  EKG   RADIOLOGY CXR interpreted by me without evidence of acute cardiopulmonary pathology.  Official radiology report(s): DG Ribs Unilateral W/Chest Left Result Date: 12/05/2023 CLINICAL DATA:  Left rib pain after fall last week. EXAM: LEFT RIBS AND CHEST - 3+ VIEW COMPARISON:  None Available. FINDINGS: No fracture or other bone lesions are seen involving the ribs. There is no evidence of pneumothorax or pleural effusion. Both lungs are clear. Heart size and mediastinal contours are within normal limits. IMPRESSION: Negative. Electronically Signed   By: Lynwood Landy Raddle M.D.   On: 12/05/2023 15:41    PROCEDURES and INTERVENTIONS:  Procedures  Medications  lidocaine  (LIDODERM ) 5 % 1 patch (has no administration in time range)  acetaminophen  (TYLENOL ) tablet 1,000 mg (1,000 mg Oral Given 12/05/23 1735)     IMPRESSION / MDM / ASSESSMENT AND PLAN / ED  COURSE  I reviewed the triage vital signs and the nursing notes.  Differential diagnosis includes, but is not limited to, rib fracture, pneumothorax, pneumonia, hollow viscus intra-abdominal injury, splenic rupture, shingles  {Patient presents with symptoms of an acute illness or injury that is potentially life-threatening.  Patient presents nearly 2 weeks after a fall with a possible rib fracture suitable for outpatient management.  He is quite healthy and functional for 89, and looks well today.  No hypoxia, distress or symptoms of pneumonia.  Plain films do not demonstrate any acute pathology.  Had a long discussion with the patient and we discussed the possibility of CT imaging.  He declines this and has capacity and seems to be understanding of my reasoning of what a CT would offer.  We discussed empiric treatment for rib fractures which is reasonable.  Provided incentive spirometer.  He has tramadol at home and we  discussed adding Tylenol  and lidocaine  patches.  His pain is controlled here and he is suitable for outpatient management.      FINAL CLINICAL IMPRESSION(S) / ED DIAGNOSES   Final diagnoses:  Closed fracture of one rib of left side, initial encounter     Rx / DC Orders   ED Discharge Orders          Ordered    lidocaine  (LIDODERM ) 5 %  Every 12 hours        12/05/23 1738             Note:  This document was prepared using Dragon voice recognition software and may include unintentional dictation errors.   Claudene Rover, MD 12/05/23 1739

## 2024-01-26 ENCOUNTER — Ambulatory Visit: Admitting: Dermatology

## 2024-01-26 ENCOUNTER — Encounter: Payer: Self-pay | Admitting: Dermatology

## 2024-01-26 DIAGNOSIS — L821 Other seborrheic keratosis: Secondary | ICD-10-CM

## 2024-01-26 DIAGNOSIS — C4492 Squamous cell carcinoma of skin, unspecified: Secondary | ICD-10-CM

## 2024-01-26 DIAGNOSIS — C44529 Squamous cell carcinoma of skin of other part of trunk: Secondary | ICD-10-CM

## 2024-01-26 DIAGNOSIS — W908XXA Exposure to other nonionizing radiation, initial encounter: Secondary | ICD-10-CM | POA: Diagnosis not present

## 2024-01-26 DIAGNOSIS — L570 Actinic keratosis: Secondary | ICD-10-CM

## 2024-01-26 DIAGNOSIS — D492 Neoplasm of unspecified behavior of bone, soft tissue, and skin: Secondary | ICD-10-CM

## 2024-01-26 HISTORY — DX: Squamous cell carcinoma of skin, unspecified: C44.92

## 2024-01-26 NOTE — Patient Instructions (Signed)

## 2024-01-26 NOTE — Progress Notes (Signed)
   Follow-Up Visit   Subjective  Sean Hines is a 89 y.o. male who presents for the following: Spots of concern on upper body.  The patient has spots, moles and lesions to be evaluated, some may be new or changing and the patient may have concern these could be cancer.    The following portions of the chart were reviewed this encounter and updated as appropriate: medications, allergies, medical history  Review of Systems:  No other skin or systemic complaints except as noted in HPI or Assessment and Plan.  Objective  Well appearing patient in no apparent distress; mood and affect are within normal limits.  A focused examination was performed of the following areas: Head, torso, arms, hands  Relevant physical exam findings are noted in the Assessment and Plan.  Mid Chest 8 mm pink scaly keratotic plaque  RUQ abdomen 7 mm pink pearly eroded papule    Assessment & Plan   SEBORRHEIC KERATOSIS - Stuck-on, waxy, tan-brown papules and/or plaques  - Benign-appearing - Discussed benign etiology and prognosis. - Observe - Call for any changes   NEOPLASM OF SKIN (2) Mid Chest Skin / nail biopsy Type of biopsy: tangential   Informed consent: discussed and consent obtained   Timeout: patient name, date of birth, surgical site, and procedure verified   Procedure prep:  Patient was prepped and draped in usual sterile fashion Prep type:  Isopropyl alcohol Anesthesia: the lesion was anesthetized in a standard fashion   Anesthetic:  1% lidocaine  w/ epinephrine 1-100,000 buffered w/ 8.4% NaHCO3 Instrument used: DermaBlade   Hemostasis achieved with: pressure and aluminum chloride   Outcome: patient tolerated procedure well   Post-procedure details: sterile dressing applied and wound care instructions given   Dressing type: bandage and petrolatum    Specimen 1 - Surgical pathology Differential Diagnosis: SCC vs ISK  Check Margins: No RUQ abdomen Skin / nail biopsy Type of  biopsy: tangential   Informed consent: discussed and consent obtained   Timeout: patient name, date of birth, surgical site, and procedure verified   Procedure prep:  Patient was prepped and draped in usual sterile fashion Prep type:  Isopropyl alcohol Anesthesia: the lesion was anesthetized in a standard fashion   Anesthetic:  1% lidocaine  w/ epinephrine 1-100,000 buffered w/ 8.4% NaHCO3 Instrument used: DermaBlade   Hemostasis achieved with: pressure and aluminum chloride   Outcome: patient tolerated procedure well   Post-procedure details: sterile dressing applied and wound care instructions given   Dressing type: bandage and petrolatum    Specimen 2 - Surgical pathology Differential Diagnosis: BCC  Check Margins: No SEBORRHEIC KERATOSES     Return if symptoms worsen or fail to improve, for pending pathology results. SABRA LILLETTE Kate Robin, CMA, am acting as scribe for Boneta Sharps, MD.   Documentation: I have reviewed the above documentation for accuracy and completeness, and I agree with the above.  Boneta Sharps, MD

## 2024-01-28 LAB — SURGICAL PATHOLOGY

## 2024-01-29 ENCOUNTER — Ambulatory Visit: Payer: Self-pay | Admitting: Dermatology

## 2024-02-02 ENCOUNTER — Encounter: Payer: Self-pay | Admitting: Dermatology

## 2024-02-02 NOTE — Telephone Encounter (Signed)
-----   Message from [facility] sent at 01/29/2024  5:49 PM EDT ----- Diagnosis: 1. Skin, mid chest :       HYPERTROPHIC ACTINIC KERATOSIS        2. Skin, RUQ abdomen :       WELL DIFFERENTIATED SQUAMOUS CELL CARCINOMA, BASE INVOLVED    Please call  Mid chest: biopsy shows a thickened precancerous actinic keratosis. This will typically resolve after a biopsy so no treatment is needed. Contact us  if it regrows  2. abdomen  Explanation: This is a squamous cell skin cancer that has grown beyond the surface of the skin and is invading the second layer of the skin. It has the potential to spread beyond the skin and  threaten your health, so I recommend treating it.  Treatment (recommended): you return for an hour long appointment where we perform a skin surgery. We numb the site of the skin cancer and a safety margin of normal skin around it. We remove the full  thickness of skin and close the wound with two layers of stitches. The sample is sent to the lab to check that the skin cancer was fully removed. Return one week later to have wound checked and  surface stitches removed. Surgical wound leaves a line scar. Approximately 95% cure rate  Alternative if excision is not preferred: you return for a 15 minute appointment where we perform electrodesiccation and curettage [facility]). This involves three rounds of scraping and burning to  destroy the skin cancer. It leaves a round wound slightly larger than the skin cancer and leaves a round white scar. No additional pathology is done. If the skin cancer comes back, we would need to  do a surgery to remove it.  ----- Message ----- From: Interface, Lab In [facility] Sent: 01/28/2024   4:46 PM EDT To: Boneta Sharps, MD

## 2024-02-05 NOTE — Telephone Encounter (Signed)
 N/A, VM full.  Lonell RAMAN., RMA

## 2024-03-02 ENCOUNTER — Ambulatory Visit: Admitting: Dermatology

## 2024-03-02 DIAGNOSIS — L821 Other seborrheic keratosis: Secondary | ICD-10-CM | POA: Diagnosis not present

## 2024-03-02 DIAGNOSIS — C4492 Squamous cell carcinoma of skin, unspecified: Secondary | ICD-10-CM

## 2024-03-02 NOTE — Telephone Encounter (Signed)
-----   Message from [facility] sent at 01/29/2024  5:49 PM EDT ----- Diagnosis: 1. Skin, mid chest :       HYPERTROPHIC ACTINIC KERATOSIS        2. Skin, RUQ abdomen :       WELL DIFFERENTIATED SQUAMOUS CELL CARCINOMA, BASE INVOLVED    Please call  Mid chest: biopsy shows a thickened precancerous actinic keratosis. This will typically resolve after a biopsy so no treatment is needed. Contact us  if it regrows  2. abdomen  Explanation: This is a squamous cell skin cancer that has grown beyond the surface of the skin and is invading the second layer of the skin. It has the potential to spread beyond the skin and  threaten your health, so I recommend treating it.  Treatment (recommended): you return for an hour long appointment where we perform a skin surgery. We numb the site of the skin cancer and a safety margin of normal skin around it. We remove the full  thickness of skin and close the wound with two layers of stitches. The sample is sent to the lab to check that the skin cancer was fully removed. Return one week later to have wound checked and  surface stitches removed. Surgical wound leaves a line scar. Approximately 95% cure rate  Alternative if excision is not preferred: you return for a 15 minute appointment where we perform electrodesiccation and curettage [facility]). This involves three rounds of scraping and burning to  destroy the skin cancer. It leaves a round wound slightly larger than the skin cancer and leaves a round white scar. No additional pathology is done. If the skin cancer comes back, we would need to  do a surgery to remove it.  ----- Message ----- From: Interface, Lab In [facility] Sent: 01/28/2024   4:46 PM EDT To: Boneta Sharps, MD

## 2024-03-02 NOTE — Progress Notes (Signed)
   Follow-Up Visit   Subjective  Sean Hines is a 89 y.o. male who presents for the following: Place at right thumb and place at back, places are itchy.   The following portions of the chart were reviewed this encounter and updated as appropriate: medications, allergies, medical history  Review of Systems:  No other skin or systemic complaints except as noted in HPI or Assessment and Plan.  Objective  Well appearing patient in no apparent distress; mood and affect are within normal limits.  A focused examination was performed of the following areas: Right hand, back   Relevant exam findings are noted in the Assessment and Plan.  RUQ Abdomen Clear on exam  Assessment & Plan   SEBORRHEIC KERATOSIS - Stuck-on, waxy, tan-brown papules and/or plaques at right dorsal hand. - Benign-appearing - Discussed benign etiology and prognosis. - Observe - Call for any changes  SQUAMOUS CELL CARCINOMA OF SKIN RUQ Abdomen Continue to monitor SEBORRHEIC KERATOSES    Return in about 6 months (around 08/31/2024) for w/ Dr. Claudene.  I, Jacquelynn V. Wilfred, CMA, am acting as scribe for Boneta Claudene, MD .   Documentation: I have reviewed the above documentation for accuracy and completeness, and I agree with the above.  Boneta Claudene, MD

## 2024-03-02 NOTE — Patient Instructions (Signed)

## 2024-03-02 NOTE — Telephone Encounter (Signed)
 Patient advised of results. Patient scheduled for [facility] on 03/30/24 with Dr. Claudene.

## 2024-03-30 ENCOUNTER — Ambulatory Visit: Admitting: Dermatology

## 2024-04-26 ENCOUNTER — Ambulatory Visit: Admitting: Dermatology

## 2024-04-27 ENCOUNTER — Ambulatory Visit: Admitting: Dermatology

## 2024-05-04 ENCOUNTER — Encounter: Payer: Self-pay | Admitting: Dermatology

## 2024-05-04 ENCOUNTER — Ambulatory Visit: Admitting: Dermatology

## 2024-05-04 DIAGNOSIS — L820 Inflamed seborrheic keratosis: Secondary | ICD-10-CM

## 2024-05-04 NOTE — Patient Instructions (Addendum)

## 2024-05-04 NOTE — Progress Notes (Signed)
° °  Follow-Up Visit   Subjective  Sean Hines is a 89 y.o. male who presents for the following: LOC  Left back shoulder loc bump and left abdomen, slightly itchy, scaly, has not bled. Has been present for 6 months.   The following portions of the chart were reviewed this encounter and updated as appropriate: medications, allergies, medical history  Review of Systems:  No other skin or systemic complaints except as noted in HPI or Assessment and Plan.  Objective  Well appearing patient in no apparent distress; mood and affect are within normal limits.  A focused examination was performed of the following areas: Back,   Relevant exam findings are noted in the Assessment and Plan.    Assessment & Plan   SEBORRHEIC KERATOSIS  - Stuck-on, waxy, tan-brown papules and/or plaques at back - Benign-appearing - Discussed benign etiology and prognosis. - Observe - Call for any changes INFLAMED SEBORRHEIC KERATOSIS (10) Back x10 (10) - Destruction of lesion - Back x10 (10) Complexity: simple   Destruction method: cryotherapy   Informed consent: discussed and consent obtained   Timeout:  patient name, date of birth, surgical site, and procedure verified Lesion destroyed using liquid nitrogen: Yes   Region frozen until ice ball extended beyond lesion: Yes   Cryo cycles: 1 or 2. Outcome: patient tolerated procedure well with no complications   Post-procedure details: wound care instructions given   Additional details:  Prior to procedure, discussed risks of blister formation, small wound, skin dyspigmentation, or rare scar following cryotherapy. Recommend Vaseline ointment to treated areas while healing.    Return for As scheduled, w/ Dr. Claudene.  IAlmetta Nora, RMA, am acting as scribe for Boneta Claudene, MD.  Documentation: I have reviewed the above documentation for accuracy and completeness, and I agree with the above.  Boneta Claudene, MD

## 2024-05-18 ENCOUNTER — Ambulatory Visit: Admitting: Dermatology

## 2024-05-27 ENCOUNTER — Other Ambulatory Visit: Payer: Self-pay | Admitting: Physical Medicine & Rehabilitation

## 2024-05-27 DIAGNOSIS — G8929 Other chronic pain: Secondary | ICD-10-CM

## 2024-06-15 ENCOUNTER — Other Ambulatory Visit: Payer: Self-pay | Admitting: Physical Medicine & Rehabilitation

## 2024-06-15 DIAGNOSIS — G8929 Other chronic pain: Secondary | ICD-10-CM

## 2024-06-21 ENCOUNTER — Other Ambulatory Visit

## 2024-06-29 ENCOUNTER — Other Ambulatory Visit

## 2024-07-06 ENCOUNTER — Ambulatory Visit: Admitting: Dermatology

## 2024-08-31 ENCOUNTER — Ambulatory Visit: Admitting: Dermatology
